# Patient Record
Sex: Male | Born: 1985 | Hispanic: No | Marital: Married | Smoking: Never smoker
Health system: Southern US, Community
[De-identification: ages and names within clinical notes are randomized; demographics above are authoritative.]

## PROBLEM LIST (undated history)

## (undated) HISTORY — PX: ABDOMINAL SURGERY: SHX537

---

## 2016-03-19 ENCOUNTER — Emergency Department (HOSPITAL_COMMUNITY): Payer: BLUE CROSS/BLUE SHIELD

## 2016-03-19 ENCOUNTER — Emergency Department (HOSPITAL_COMMUNITY)
Admission: EM | Admit: 2016-03-19 | Discharge: 2016-03-19 | Disposition: A | Discharge status: Home / Self Care | Payer: BLUE CROSS/BLUE SHIELD | Attending: Emergency Medicine | Admitting: Emergency Medicine

## 2016-03-19 ENCOUNTER — Encounter (HOSPITAL_COMMUNITY): Payer: Self-pay

## 2016-03-19 DIAGNOSIS — R05 Cough: Secondary | ICD-10-CM | POA: Diagnosis present

## 2016-03-19 DIAGNOSIS — J0100 Acute maxillary sinusitis, unspecified: Secondary | ICD-10-CM

## 2016-03-19 MED ORDER — DOXYCYCLINE HYCLATE 100 MG PO CAPS: 100.0000 mg | ORAL_CAPSULE | Freq: Two times a day (BID) | ORAL | 0 refills | Status: DC

## 2016-03-19 MED ORDER — FLUTICASONE PROPIONATE 50 MCG/ACT NA SUSP: 1.0000 | Freq: Every day | NASAL | 0 refills | Status: AC

## 2016-03-19 NOTE — ED Provider Notes (Signed)
[facility]-EMERGENCY DEPT Provider Note   CSN: [id number] Arrival date & time: 03/19/16  1532   By signing my name below, I, Martin Jackson, attest that this documentation has been prepared under the direction and in the presence of Audry Pili, PA-C. Electronically Signed: Freida Jackson, Scribe. 03/19/2016. 4:52 PM.   History   Chief Complaint Chief Complaint  Patient presents with  . Cough  . Otalgia    LEFT     The history is provided by the patient. No language interpreter was used.   HPI Comments:  Martin Jackson is a 31 y.o. male who presents to the Emergency Department complaining of dry cough x 2 days; worse today. He has associated sore throat, left ear pain, subjective fever, and rhinorrhea. He has taken OTC cold and sinus medicine and states he seemed to worsen afterward. He denies nausea, vomiting, and diarrhea. No other symptoms noted.    History reviewed. No pertinent past medical history.  There are no active problems to display for this patient.   History reviewed. No pertinent surgical history.   Home Medications    Prior to Admission medications   Not on File    Family History History reviewed. No pertinent family history.  Social History Social History  Substance Use Topics  . Smoking status: Never Smoker  . Smokeless tobacco: Never Used  . Alcohol use No     Allergies   Nyquil multi-symptom [pseudoeph-doxylamine-dm-apap]   Review of Systems Review of Systems  Constitutional: Positive for fever.  HENT: Positive for ear pain, rhinorrhea and sore throat.   Respiratory: Positive for cough.   Gastrointestinal: Negative for diarrhea, nausea and vomiting.     Physical Exam Updated Vital Signs BP 125/82 (BP Location: Left Arm)   Pulse 99   Temp 98.6 F (37 C) (Oral)   Resp 20   Ht 5\' 8"  (1.727 m)   Wt 154 lb 5.2 oz (70 kg)   SpO2 99%   BMI 23.46 kg/m   Physical Exam  Constitutional: He is oriented to person, place, and time. He appears  well-developed and well-nourished. No distress.  HENT:  Head: Normocephalic and atraumatic.  Right Ear: Tympanic membrane, external ear and ear canal normal.  Left Ear: Tympanic membrane, external ear and ear canal normal.  Nose: Nose normal.  Mouth/Throat: Uvula is midline, oropharynx is clear and moist and mucous membranes are normal. No trismus in the jaw. No oropharyngeal exudate, posterior oropharyngeal erythema or tonsillar abscesses. No tonsillar exudate.  Eyes: Conjunctivae and EOM are normal. Pupils are equal, round, and reactive to light.  Neck: Normal range of motion. Neck supple. No tracheal deviation present.  Cardiovascular: Normal rate, regular rhythm, S1 normal, S2 normal, normal heart sounds, intact distal pulses and normal pulses.   Pulmonary/Chest: Effort normal and breath sounds normal. No respiratory distress. He has no decreased breath sounds. He has no wheezes. He has no rhonchi. He has no rales.  Abdominal: Normal appearance and bowel sounds are normal. He exhibits no distension. There is no tenderness.  Musculoskeletal: Normal range of motion.  Neurological: He is alert and oriented to person, place, and time.  Skin: Skin is warm and dry.  Psychiatric: He has a normal mood and affect. His speech is normal and behavior is normal. Thought content normal.  Nursing note and vitals reviewed.  ED Treatments / Results  DIAGNOSTIC STUDIES:  Oxygen Saturation is 99% on RA, normal by my interpretation.    COORDINATION OF CARE:  4:52 PM Discussed  treatment plan with pt at bedside and pt agreed to plan.  Labs (all labs ordered are listed, but only abnormal results are displayed) Labs Reviewed - No data to display  EKG  EKG Interpretation None       Radiology No results found.  Procedures Procedures (including critical care time)  Medications Ordered in ED Medications - No data to display   Initial Impression / Assessment and Plan / ED Course  I have  reviewed the triage vital signs and the nursing notes.  Pertinent labs & imaging results that were available during my care of the patient were reviewed by me and considered in my medical decision making (see chart for details).  Final Clinical Impressions(s) / ED Diagnoses      {I have reviewed the relevant previous healthcare records.  {I obtained HPI from historian.   ED Course:  Assessment: Pt is a 31 y.o. male presents with URI symptoms x2 days. No fever. On exam, pt in NAD. VSS. Afebrile. Lungs CTA, Heart RRR. Abdomen nontender/soft. Pt CXR negative for acute infiltrate. Patients symptoms are consistent with URI, likely viral etiology. Discussed that antibiotics are not indicated for viral infections. Given Rx Doxy to take in 1 week with no improvement. Likely viral sinusitis causing pressure in left ear as exam unremarkable. Pt will be discharged with symptomatic treatment.  Verbalizes understanding and is agreeable with plan. Pt is hemodynamically stable & in NAD prior to dc.  Disposition/Plan:  DC Home Additional Verbal discharge instructions given and discussed with patient.  Pt Instructed to f/u with PCP in the next week for evaluation and treatment of symptoms. Return precautions given Pt acknowledges and agrees with plan  Supervising Physician Lyndal Pulleyaniel Knott, MD  Final diagnoses:  Acute non-recurrent maxillary sinusitis    New Prescriptions New Prescriptions   No medications on file    I personally performed the services described in this documentation, which was scribed in my presence. The recorded information has been reviewed and is accurate.    Audry Piliyler Julion Gatt, PA-C 03/19/16 1658    Lyndal Pulleyaniel Knott, MD 03/20/16 [phone]

## 2016-03-19 NOTE — Discharge Instructions (Signed)
Please read and follow all provided instructions.  Your diagnoses today include:  1. Acute non-recurrent maxillary sinusitis     Tests performed today include: Vital signs. See below for your results today.   Medications prescribed:  Take as prescribed   Home care instructions:  Follow any educational materials contained in this packet.  Follow-up instructions: Please follow-up with your primary care provider for further evaluation of symptoms and treatment   Return instructions:  Please return to the Emergency Department if you do not get better, if you get worse, or new symptoms OR  - Fever (temperature greater than 101.18F)  - Bleeding that does not stop with holding pressure to the area    -Severe pain (please note that you may be more sore the day after your accident)  - Chest Pain  - Difficulty breathing  - Severe nausea or vomiting  - Inability to tolerate food and liquids  - Passing out  - Skin becoming red around your wounds  - Change in mental status (confusion or lethargy)  - New numbness or weakness    Please return if you have any other emergent concerns.  Additional Information:  Your vital signs today were: BP 125/82 (BP Location: Left Arm)    Pulse 99    Temp 98.6 F (37 C) (Oral)    Resp 20    Ht 5\' 8"  (1.727 m)    Wt 70 kg    SpO2 99%    BMI 23.46 kg/m  If your blood pressure (BP) was elevated above 135/85 this visit, please have this repeated by your doctor within one month. ---------------

## 2016-03-19 NOTE — ED Triage Notes (Signed)
PT C/O COUGH, SORE THROAT, AND LEFT EAR ACHE X2 DAYS. DENIES FEVER.

## 2017-09-04 ENCOUNTER — Ambulatory Visit (HOSPITAL_COMMUNITY): Payer: BLUE CROSS/BLUE SHIELD | Admitting: Psychiatry

## 2018-05-24 ENCOUNTER — Encounter (HOSPITAL_COMMUNITY): Payer: Self-pay | Admitting: Emergency Medicine

## 2018-05-24 ENCOUNTER — Emergency Department (HOSPITAL_COMMUNITY)
Admission: EM | Admit: 2018-05-24 | Discharge: 2018-05-24 | Disposition: A | Discharge status: Home / Self Care | Payer: BLUE CROSS/BLUE SHIELD | Attending: Emergency Medicine | Admitting: Emergency Medicine

## 2018-05-24 ENCOUNTER — Other Ambulatory Visit: Payer: Self-pay

## 2018-05-24 DIAGNOSIS — B349 Viral infection, unspecified: Secondary | ICD-10-CM | POA: Diagnosis not present

## 2018-05-24 DIAGNOSIS — Z03818 Encounter for observation for suspected exposure to other biological agents ruled out: Secondary | ICD-10-CM | POA: Insufficient documentation

## 2018-05-24 DIAGNOSIS — J029 Acute pharyngitis, unspecified: Secondary | ICD-10-CM | POA: Diagnosis present

## 2018-05-24 LAB — GROUP A STREP BY PCR: Group A Strep by PCR: NOT DETECTED

## 2018-05-24 NOTE — ED Triage Notes (Signed)
Pt reports that he had sore throat for couple days and before that he had a cough. Pt scared and wants to make sure he doesn't have "the virus". Denies fevers or recent travel.

## 2018-05-24 NOTE — ED Provider Notes (Signed)
Martin Jackson Provider Note   CSN: [id number] Arrival date & time: 05/24/18  1804    History   Chief Complaint Chief Complaint  Patient presents with  . Sore Throat    HPI Martin Jackson is a 33 y.o. male who presents to the ED complaining of a gradual onset, constant, sore throat x 3-4 days. Pt also reports he had an episode of coughing spells 4 days ago where he coughed several times in a row; pt is concerned he could have covid. No known exposure to covid positive patients although pt works at [employer] and states people have been coming in and out without masks. Pt denies fever, chills, drooling, difficulty swallowing, ear pain, shortness of breath, or any other associated symptoms.       History reviewed. No pertinent past medical history.  There are no active problems to display for this patient.   History reviewed. No pertinent surgical history.      Home Medications    Prior to Admission medications   Medication Sig Start Date End Date Taking? Authorizing Provider  doxycycline (VIBRAMYCIN) 100 MG capsule Take 1 capsule (100 mg total) by mouth 2 (two) times daily. 03/23/16   Audry PiliMohr, Tyler, PA-C  fluticasone (FLONASE) 50 MCG/ACT nasal spray Place 1 spray into both nostrils daily. 03/19/16   Audry PiliMohr, Tyler, PA-C    Family History No family history on file.  Social History Social History   Tobacco Use  . Smoking status: Never Smoker  . Smokeless tobacco: Never Used  Substance Use Topics  . Alcohol use: No  . Drug use: No     Allergies   Nyquil multi-symptom [pseudoeph-doxylamine-dm-apap]   Review of Systems Review of Systems  Constitutional: Negative for chills and fever.  HENT: Positive for sore throat. Negative for congestion, ear pain, rhinorrhea, sinus pain, trouble swallowing and voice change.   Eyes: Negative for pain and redness.  Respiratory: Positive for cough (resolved). Negative for shortness of breath.    Cardiovascular: Negative for chest pain.  Gastrointestinal: Negative for abdominal pain, constipation, diarrhea, nausea and vomiting.     Physical Exam Updated Vital Signs BP 136/86 (BP Location: Right Arm)   Pulse 90   Temp 98.1 F (36.7 C) (Oral)   Resp 18   SpO2 100%   Physical Exam Vitals signs and nursing note reviewed.  Constitutional:      Appearance: He is not ill-appearing.  HENT:     Head: Normocephalic and atraumatic.     Right Ear: Tympanic membrane normal.     Left Ear: Tympanic membrane normal.     Nose: No congestion or rhinorrhea.     Mouth/Throat:     Mouth: Mucous membranes are moist.     Pharynx: Posterior oropharyngeal erythema present. No oropharyngeal exudate or uvula swelling.     Tonsils: No tonsillar exudate or tonsillar abscesses.  Eyes:     Conjunctiva/sclera: Conjunctivae normal.  Neck:     Musculoskeletal: Normal range of motion and neck supple.  Cardiovascular:     Rate and Rhythm: Normal rate and regular rhythm.     Heart sounds: Normal heart sounds. No murmur.  Pulmonary:     Effort: Pulmonary effort is normal.     Breath sounds: Normal breath sounds. No wheezing, rhonchi or rales.  Abdominal:     Palpations: Abdomen is soft.     Tenderness: There is no abdominal tenderness.  Lymphadenopathy:     Cervical: No cervical adenopathy.  Skin:  General: Skin is warm and dry.  Neurological:     Mental Status: He is alert.      ED Treatments / Results  Labs (all labs ordered are listed, but only abnormal results are displayed) Labs Reviewed  GROUP A STREP BY PCR  NOVEL CORONAVIRUS, NAA (HOSPITAL ORDER, SEND-OUT TO REF LAB)    EKG None  Radiology No results found.  Procedures Procedures (including critical care time)  Medications Ordered in ED Medications - No data to display   Initial Impression / Assessment and Plan / ED Course  I have reviewed the triage vital signs and the nursing notes.  Pertinent labs & imaging  results that were available during my care of the patient were reviewed by me and considered in my medical decision making (see chart for details).    Pt is a 32 year old pt who presents with sore throat x 3-4 days. 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 Pt had a coughing fit 4 days ago but has not coughed since. He is afebrile in the ED. Satting 100% on RA. No increased work of breathing or accessory muscle usage. Had a long discussion with patient regarding the fact that we are reserving tests for admitted patients; pt seems to not understand this fact very well. Will test for strep given sore throat at this point and may consider sending out send out test for covid given patients increased anxiety.   Strep test negative. Patient still very anxious about what is causing his symptoms; suspect viral pharyngitis at this time. When told he likely has a virus causing his sore throat he appears very concerned I am talking about coronavirus. Will send [facility] test at this time to reassure patient. Advised to stay home until he receives results. Pt is in agreement with plan at this time.  Martin Jackson was evaluated in Emergency Department on 05/24/2018 for the symptoms described in the history of present illness. He was evaluated in the context of the global COVID-19 pandemic, which necessitated consideration that the patient might be at risk for infection with the SARS-CoV-2 virus that causes COVID-19. Institutional protocols and algorithms that pertain to the evaluation of patients at risk for COVID-19 are in a state of rapid change based on information released by regulatory bodies including the CDC and federal and state organizations. These policies and algorithms were followed during the patient's care in the ED.        Final Clinical Impressions(s) / ED Diagnoses   Final diagnoses:  Viral pharyngitis  Viral illness    ED  Discharge Orders    None       Tanda Rockers, Cordelia Poche 05/24/18 2126    Gerhard Munch, MD 05/24/18 2317

## 2018-05-24 NOTE — Discharge Instructions (Addendum)
You were seen in the ED today for a sore throat; your strep test was negative. We have sent out a coronavirus test; it can take up to 5 days to get back. Please stay home until you get a call about the results. Please follow up with your PCP; if you do not have one you can follow up with [facility] for your primary care needs.

## 2018-05-26 LAB — NOVEL CORONAVIRUS, NAA (HOSP ORDER, SEND-OUT TO REF LAB; TAT 18-24 HRS): SARS-CoV-2, NAA: NOT DETECTED

## 2018-09-19 ENCOUNTER — Other Ambulatory Visit: Payer: Self-pay

## 2018-09-19 DIAGNOSIS — Z20822 Contact with and (suspected) exposure to covid-19: Secondary | ICD-10-CM

## 2018-09-21 LAB — NOVEL CORONAVIRUS, NAA: SARS-CoV-2, NAA: NOT DETECTED

## 2018-12-26 ENCOUNTER — Emergency Department (HOSPITAL_COMMUNITY)
Admission: EM | Admit: 2018-12-26 | Discharge: 2018-12-27 | Disposition: A | Discharge status: Home / Self Care | Payer: BLUE CROSS/BLUE SHIELD | Attending: Emergency Medicine | Admitting: Emergency Medicine

## 2018-12-26 ENCOUNTER — Emergency Department (HOSPITAL_COMMUNITY): Payer: BLUE CROSS/BLUE SHIELD

## 2018-12-26 ENCOUNTER — Encounter (HOSPITAL_COMMUNITY): Payer: Self-pay | Admitting: Emergency Medicine

## 2018-12-26 ENCOUNTER — Other Ambulatory Visit: Payer: Self-pay

## 2018-12-26 DIAGNOSIS — R079 Chest pain, unspecified: Secondary | ICD-10-CM | POA: Diagnosis present

## 2018-12-26 DIAGNOSIS — R0789 Other chest pain: Secondary | ICD-10-CM | POA: Insufficient documentation

## 2018-12-26 LAB — CBC
HCT: 46.3 % (ref 39.0–52.0)
Hemoglobin: 15.3 g/dL (ref 13.0–17.0)
MCH: 30.9 pg (ref 26.0–34.0)
MCHC: 33 g/dL (ref 30.0–36.0)
MCV: 93.5 fL (ref 80.0–100.0)
Platelets: 221 10*3/uL (ref 150–400)
RBC: 4.95 MIL/uL (ref 4.22–5.81)
RDW: 12 % (ref 11.5–15.5)
WBC: 7.5 10*3/uL (ref 4.0–10.5)
nRBC: 0 % (ref 0.0–0.2)

## 2018-12-26 MED ORDER — SODIUM CHLORIDE 0.9% FLUSH: 3.0000 mL | Freq: Once | INTRAVENOUS | Status: DC

## 2018-12-26 NOTE — ED Triage Notes (Signed)
Patient complaining of left chest pain that has been going on for three weeks. Patient states it feels like it is going to bust out.

## 2018-12-27 ENCOUNTER — Other Ambulatory Visit: Payer: Self-pay

## 2018-12-27 LAB — BASIC METABOLIC PANEL
Anion gap: 6 (ref 5–15)
BUN: 21 mg/dL — ABNORMAL HIGH (ref 6–20)
CO2: 30 mmol/L (ref 22–32)
Calcium: 9.5 mg/dL (ref 8.9–10.3)
Chloride: 105 mmol/L (ref 98–111)
Creatinine, Ser: 0.79 mg/dL (ref 0.61–1.24)
GFR calc Af Amer: >60 mL/min (ref >60)
GFR calc non Af Amer: >60 mL/min (ref >60)
Glucose, Bld: 92 mg/dL (ref 70–99)
Potassium: 4.4 mmol/L (ref 3.5–5.1)
Sodium: 141 mmol/L (ref 135–145)

## 2018-12-27 LAB — TROPONIN I (HIGH SENSITIVITY)
Troponin I (High Sensitivity): 3 ng/L (ref <18)
Troponin I (High Sensitivity): <2 ng/L (ref <18)

## 2018-12-27 NOTE — ED Notes (Signed)
Repeat EKG done, per [place], [place]

## 2018-12-27 NOTE — ED Notes (Signed)
PT DISCHARGED. INSTRUCTIONS GIVEN. AAOX4. PT IN NO APPARENT DISTRESS OR PAIN. THE OPPORTUNITY TO ASK QUESTIONS WAS PROVIDED. 

## 2018-12-27 NOTE — ED Provider Notes (Signed)
[facility]-EMERGENCY DEPT Provider Note   CSN: [id number] Arrival date & time: 12/26/18  2210     History Chief Complaint  Patient presents with  . Chest Pain    Martin Jackson is a 33 y.o. male with no significant past medical history presents today for evaluation of left-sided chest pain.  He reports that it has been going on intermittently over the past few months however has gotten more frequent over the past week.  He states that he notes his chest pain gets worse when he argues with his wife or has anything stressful happen.  He states that the chest pain lasts for 1 to 3 hours before it goes away.  He reports he is under significant stress.  He denies any fevers.  No shortness of breath.  No nausea, vomiting, or diarrhea.  He denies any hemoptysis.  No history of VTE.  He denies any family history of MI before the age of 4.  No personal cardiac history.  He denies any recent trauma.  His pain does not radiate or move.  No specific alleviating factors noted, rather "it just goes away."    HPI     History reviewed. No pertinent past medical history.  There are no problems to display for this patient.   History reviewed. No pertinent surgical history.     History reviewed. No pertinent family history.  Social History   Tobacco Use  . Smoking status: Never Smoker  . Smokeless tobacco: Never Used  Substance Use Topics  . Alcohol use: No  . Drug use: No    Home Medications Prior to Admission medications   Medication Sig Start Date End Date Taking? Authorizing Provider  doxycycline (VIBRAMYCIN) 100 MG capsule Take 1 capsule (100 mg total) by mouth 2 (two) times daily. 03/23/16   Audry Pili, PA-C  fluticasone (FLONASE) 50 MCG/ACT nasal spray Place 1 spray into both nostrils daily. 03/19/16   Audry Pili, PA-C    Allergies    Nyquil multi-symptom [pseudoeph-doxylamine-dm-apap]  Review of Systems   Review of Systems  Constitutional: Negative  for chills and fever.  HENT: Negative for congestion.   Eyes: Negative for visual disturbance.  Respiratory: Negative for choking and shortness of breath.   Cardiovascular: Positive for chest pain. Negative for palpitations and leg swelling.  Gastrointestinal: Negative for abdominal pain, diarrhea, nausea and vomiting.  Genitourinary: Negative for dysuria and urgency.  Musculoskeletal: Negative for back pain and neck pain.  Skin: Negative for color change, rash and wound.  Neurological: Negative for light-headedness and headaches.  Psychiatric/Behavioral: The patient is nervous/anxious.   All other systems reviewed and are negative.   Physical Exam Updated Vital Signs BP 110/82 (BP Location: Left Arm)   Pulse 63   Temp 98 F (36.7 C) (Oral)   Resp 17   Ht 5\' 8"  (1.727 m)   Wt 81.6 kg   SpO2 99%   BMI 27.37 kg/m   Physical Exam Vitals and nursing note reviewed.  Constitutional:      Appearance: He is well-developed.  HENT:     Head: Normocephalic and atraumatic.  Eyes:     Conjunctiva/sclera: Conjunctivae normal.  Cardiovascular:     Rate and Rhythm: Normal rate and regular rhythm.     Pulses:          Radial pulses are 2+ on the right side and 2+ on the left side.       Dorsalis pedis pulses are 2+ on the right  side and 2+ on the left side.       Posterior tibial pulses are 2+ on the right side and 2+ on the left side.     Heart sounds: Normal heart sounds. No murmur.  Pulmonary:     Effort: Pulmonary effort is normal. No respiratory distress.     Breath sounds: Normal breath sounds. No decreased breath sounds or wheezing.  Chest:     Chest wall: Tenderness (Mild) present. No deformity.  Abdominal:     Palpations: Abdomen is soft.     Tenderness: There is no abdominal tenderness.  Musculoskeletal:     Cervical back: Normal range of motion and neck supple.     Right lower leg: No tenderness. No edema.     Left lower leg: No tenderness. No edema.  Skin:     General: Skin is warm and dry.  Neurological:     General: No focal deficit present.     Mental Status: He is alert.     Cranial Nerves: No cranial nerve deficit.  Psychiatric:        Mood and Affect: Mood is anxious.        Behavior: Behavior normal.     ED Results / Procedures / Treatments   Labs (all labs ordered are listed, but only abnormal results are displayed) Labs Reviewed  BASIC METABOLIC PANEL - Abnormal; Notable for the following components:      Result Value   BUN 21 (*)    All other components within normal limits  CBC  TROPONIN I (HIGH SENSITIVITY)  TROPONIN I (HIGH SENSITIVITY)    EKG EKG Interpretation  Date/Time:  Saturday December 27 2018 02:11:05 EST Ventricular Rate:  61 PR Interval:    QRS Duration: 109 QT Interval:  432 QTC Calculation: 436 R Axis:   32 Text Interpretation: Sinus rhythm Confirmed by Randal Buba, April ([id number]) on 12/27/2018 2:28:48 AM   Radiology DG Chest 2 View  Result Date: 12/26/2018 CLINICAL DATA:  Chest pain EXAM: CHEST - 2 VIEW COMPARISON:  March 19, 2016 FINDINGS: The heart size and mediastinal contours are within normal limits. Both lungs are clear. The visualized skeletal structures are unremarkable. IMPRESSION: No active cardiopulmonary disease. Electronically Signed   By: Constance Holster M.D.   On: 12/26/2018 23:05    Procedures Procedures (including critical care time)  Medications Ordered in ED Medications - No data to display  ED Course  I have reviewed the triage vital signs and the nursing notes.  Pertinent labs & imaging results that were available during my care of the patient were reviewed by me and considered in my medical decision making (see chart for details).    MDM Rules/Calculators/A&P                     Patient presents today for evaluation of chest pain worsening over the past 3 weeks.  His chest pain is intermittent and appears to be triggered by stressful events such as arguing with his  wife.  He denies any shortness of breath.  He is PERC negative.  EKG without evidence of ischemia, troponin x2 not elevated do not suspect ACS.  Chest x-ray obtained without evidence of pneumothorax, consolidation, or other abnormalities, do not suspect pneumonia.  He denies any history of asthma or wheezing, no fevers.  Labs are obtained and reviewed without significant hematologic or electrolyte derangements.  I suspect that his cp is related to msk pain with a significant component of anxiety.  Return precautions were discussed with patient who states their understanding.  At the time of discharge patient denied any unaddressed complaints or concerns.  Patient is agreeable for discharge home.   Final Clinical Impression(s) / ED Diagnoses Final diagnoses:  Atypical chest pain    Rx / DC Orders ED Discharge Orders    None       Norman ClayHammond, Hortencia Martire W, PA-C 12/27/18 [id number]    Palumbo, April, MD 12/27/18 [phone]

## 2019-05-06 ENCOUNTER — Other Ambulatory Visit: Payer: Self-pay

## 2019-05-06 ENCOUNTER — Emergency Department (HOSPITAL_COMMUNITY)
Admission: EM | Admit: 2019-05-06 | Discharge: 2019-05-07 | Disposition: A | Discharge status: Other Acute Inpatient Hospital | Payer: BLUE CROSS/BLUE SHIELD | Attending: Emergency Medicine | Admitting: Emergency Medicine

## 2019-05-06 DIAGNOSIS — Y999 Unspecified external cause status: Secondary | ICD-10-CM | POA: Insufficient documentation

## 2019-05-06 DIAGNOSIS — Y9389 Activity, other specified: Secondary | ICD-10-CM | POA: Insufficient documentation

## 2019-05-06 DIAGNOSIS — Y929 Unspecified place or not applicable: Secondary | ICD-10-CM | POA: Insufficient documentation

## 2019-05-06 DIAGNOSIS — X780XXA Intentional self-harm by sharp glass, initial encounter: Secondary | ICD-10-CM | POA: Insufficient documentation

## 2019-05-06 DIAGNOSIS — S61412A Laceration without foreign body of left hand, initial encounter: Secondary | ICD-10-CM

## 2019-05-06 DIAGNOSIS — Z20822 Contact with and (suspected) exposure to covid-19: Secondary | ICD-10-CM | POA: Insufficient documentation

## 2019-05-06 DIAGNOSIS — F322 Major depressive disorder, single episode, severe without psychotic features: Secondary | ICD-10-CM | POA: Insufficient documentation

## 2019-05-06 DIAGNOSIS — R45851 Suicidal ideations: Secondary | ICD-10-CM | POA: Insufficient documentation

## 2019-05-06 LAB — RAPID URINE DRUG SCREEN, HOSP PERFORMED
Amphetamines: NOT DETECTED
Barbiturates: NOT DETECTED
Benzodiazepines: NOT DETECTED
Cocaine: NOT DETECTED
Opiates: NOT DETECTED
Tetrahydrocannabinol: NOT DETECTED

## 2019-05-06 LAB — CBC WITH DIFFERENTIAL/PLATELET
Abs Immature Granulocytes: 0.01 10*3/uL (ref 0.00–0.07)
Basophils Absolute: 0 10*3/uL (ref 0.0–0.1)
Basophils Relative: 0 %
Eosinophils Absolute: 0 10*3/uL (ref 0.0–0.5)
Eosinophils Relative: 0 %
HCT: 46.5 % (ref 39.0–52.0)
Hemoglobin: 15.6 g/dL (ref 13.0–17.0)
Immature Granulocytes: 0 %
Lymphocytes Relative: 16 %
Lymphs Abs: 1.2 10*3/uL (ref 0.7–4.0)
MCH: 30.6 pg (ref 26.0–34.0)
MCHC: 33.5 g/dL (ref 30.0–36.0)
MCV: 91.2 fL (ref 80.0–100.0)
Monocytes Absolute: 0.6 10*3/uL (ref 0.1–1.0)
Monocytes Relative: 7 %
Neutro Abs: 5.8 10*3/uL (ref 1.7–7.7)
Neutrophils Relative %: 77 %
Platelets: 141 10*3/uL — ABNORMAL LOW (ref 150–400)
RBC: 5.1 MIL/uL (ref 4.22–5.81)
RDW: 11.6 % (ref 11.5–15.5)
WBC: 7.6 10*3/uL (ref 4.0–10.5)
nRBC: 0 % (ref 0.0–0.2)

## 2019-05-06 LAB — URINALYSIS, ROUTINE W REFLEX MICROSCOPIC
Bilirubin Urine: NEGATIVE
Glucose, UA: NEGATIVE mg/dL
Ketones, ur: 80 mg/dL — AB
Leukocytes,Ua: NEGATIVE
Nitrite: NEGATIVE
Protein, ur: 30 mg/dL — AB
Specific Gravity, Urine: 1.031 — ABNORMAL HIGH (ref 1.005–1.030)
pH: 5 (ref 5.0–8.0)

## 2019-05-06 LAB — ETHANOL: Alcohol, Ethyl (B): <10 mg/dL (ref <10)

## 2019-05-06 LAB — BASIC METABOLIC PANEL
Anion gap: 10 (ref 5–15)
BUN: 22 mg/dL — ABNORMAL HIGH (ref 6–20)
CO2: 25 mmol/L (ref 22–32)
Calcium: 9.4 mg/dL (ref 8.9–10.3)
Chloride: 102 mmol/L (ref 98–111)
Creatinine, Ser: 0.81 mg/dL (ref 0.61–1.24)
GFR calc Af Amer: >60 mL/min (ref >60)
GFR calc non Af Amer: >60 mL/min (ref >60)
Glucose, Bld: 89 mg/dL (ref 70–99)
Potassium: 3.5 mmol/L (ref 3.5–5.1)
Sodium: 137 mmol/L (ref 135–145)

## 2019-05-06 MED ORDER — LIDOCAINE HCL (PF) 1 % IJ SOLN: 5.0000 mL | Freq: Once | INTRAMUSCULAR | Status: DC

## 2019-05-06 MED ORDER — LIDOCAINE HCL 1 % IJ SOLN: 5.0000 mL | Freq: Once | INTRAMUSCULAR | Status: DC

## 2019-05-06 MED ORDER — LIDOCAINE HCL 1 % IJ SOLN
INTRAMUSCULAR | Status: AC
  Filled 2019-05-06: qty 20

## 2019-05-06 NOTE — ED Provider Notes (Signed)
[facility]-EMERGENCY DEPT Provider Note   CSN: [id number] Arrival date & time: 05/06/19  1804     History Chief Complaint  Patient presents with  . Suicide Attempt    Martin Jackson is a 34 y.o. male.  Patient is a 34 year old male with no past medical or psychiatric history.  He presents today for evaluation of a self-inflicted laceration to the left hand.  He reports feelings of depression and suicidal ideation recently and did this in attempt to harm, but not kill himself.  His ex-wife called 911 and he was brought here.  He denies drug or alcohol use.  No other complaints.  The history is provided by the patient.       No past medical history on file.  There are no problems to display for this patient.   No past surgical history on file.     No family history on file.  Social History   Tobacco Use  . Smoking status: Never Smoker  . Smokeless tobacco: Never Used  Substance Use Topics  . Alcohol use: No  . Drug use: No    Home Medications Prior to Admission medications   Medication Sig Start Date End Date Taking? Authorizing Provider  doxycycline (VIBRAMYCIN) 100 MG capsule Take 1 capsule (100 mg total) by mouth 2 (two) times daily. 03/23/16   Audry Pili, PA-C  fluticasone (FLONASE) 50 MCG/ACT nasal spray Place 1 spray into both nostrils daily. 03/19/16   Audry Pili, PA-C    Allergies    Nyquil multi-symptom [pseudoeph-doxylamine-dm-apap]  Review of Systems   Review of Systems  All other systems reviewed and are negative.   Physical Exam Updated Vital Signs BP (!) 118/96 (BP Location: Left Arm)   Pulse 84   Temp (!) 97.4 F (36.3 C) (Oral)   Resp 16   Ht 5\' 8"  (1.727 m)   Wt 75 kg   SpO2 98%   BMI 25.14 kg/m   Physical Exam Vitals and nursing note reviewed.  Constitutional:      General: He is not in acute distress.    Appearance: He is well-developed. He is not diaphoretic.  HENT:     Head: Normocephalic and  atraumatic.  Cardiovascular:     Rate and Rhythm: Normal rate and regular rhythm.     Heart sounds: No murmur. No friction rub.  Pulmonary:     Effort: Pulmonary effort is normal. No respiratory distress.     Breath sounds: Normal breath sounds. No wheezing or rales.  Abdominal:     General: Bowel sounds are normal. There is no distension.     Palpations: Abdomen is soft.     Tenderness: There is no abdominal tenderness.  Musculoskeletal:        General: Normal range of motion.     Cervical back: Normal range of motion and neck supple.     Comments: There is a 1.5 cm laceration to the lateral aspect of the left hand over the fifth metacarpal.  He has full range of motion of the fifth digit with no evidence for tendon laceration.  Capillary refill is brisk and sensation is intact throughout the entire fifth finger.  Skin:    General: Skin is warm and dry.  Neurological:     Mental Status: He is alert and oriented to person, place, and time.     Coordination: Coordination normal.     ED Results / Procedures / Treatments   Labs (all labs ordered  are listed, but only abnormal results are displayed) Labs Reviewed  BASIC METABOLIC PANEL  CBC WITH DIFFERENTIAL/PLATELET  ETHANOL  URINALYSIS, ROUTINE W REFLEX MICROSCOPIC  RAPID URINE DRUG SCREEN, HOSP PERFORMED    EKG None  Radiology No results found.  Procedures Procedures (including critical care time)  Medications Ordered in ED Medications  lidocaine (PF) (XYLOCAINE) 1 % injection 5 mL (has no administration in time range)    ED Course  I have reviewed the triage vital signs and the nursing notes.  Pertinent labs & imaging results that were available during my care of the patient were reviewed by me and considered in my medical decision making (see chart for details).    MDM Rules/Calculators/A&P  Patient presenting here with complaints of a self-inflicted wound to his left hand he states that he made attempting to  harm himself.  He reports a history of depression worsening over the past year since the divorce from his wife.  Patient has been evaluated by TTS and felt to meet inpatient criteria.  Patient is voluntary and agrees to this treatment.  The laceration was repaired as below.  LACERATION REPAIR Performed by: Veryl Speak Authorized by: Veryl Speak Consent: Verbal consent obtained. Risks and benefits: risks, benefits and alternatives were discussed Consent given by: patient Patient identity confirmed: provided demographic data Prepped and Draped in normal sterile fashion Wound explored  Laceration Location: left hand  Laceration Length: 2.5cm  No Foreign Bodies seen or palpated  Anesthesia: local infiltration  Local anesthetic: lidocaine 1% without epinephrine  Anesthetic total: 3 ml  Irrigation method: syringe Amount of cleaning: standard  Skin closure: 4-0 prolene  Number of sutures: 3  Technique: simple interrupted  Patient tolerance: Patient tolerated the procedure well with no immediate complications.   Final Clinical Impression(s) / ED Diagnoses Final diagnoses:  None    Rx / DC Orders ED Discharge Orders    None       Veryl Speak, MD 05/06/19 2337

## 2019-05-06 NOTE — Progress Notes (Signed)
Patient meets inpatient criteria per Renaye Rakers, NP. Patient has been referred to the following facilities for review:   [facility]  [facility]  [facility]  [facility]  [facility]  [facility]  [facility]  TTS will continue to follow and assist with securing bed placement.   Drucilla Schmidt, MSW, LCSW-A Clinical Disposition Social Worker [facility]/TTS [phone]

## 2019-05-06 NOTE — Progress Notes (Signed)
05/06/2019  1840  Labs drawn.

## 2019-05-06 NOTE — Progress Notes (Signed)
Received Martin Jackson at the change of shift awake in his room with the sitter at the bedside. He made several phone calls annd talked with TTS. His wife called after 12 mn to talk with him and was informed he is sleeping. The EKG and Covid test was done at 053500 hrs.

## 2019-05-06 NOTE — ED Triage Notes (Signed)
05/06/2019  1812  Patient states he cut himself with glass in the hand as a SI.

## 2019-05-06 NOTE — BH Assessment (Signed)
Tele Assessment Note   Patient Name: Martin Jackson MRN: [id number] Referring Physician: Chriss Driver Location of Patient: [facility] Location of Provider: Behavioral Health TTS Department  Martin Jackson is an 34 y.o. male. Pt [facility] to [facility] accompanied by EMS for suicidal attempt of cutting his hand. Pt states that he was arguing with his wife on the phone and became upset and took a glass cup and cut himself with it. Pt denies current SI, HI, AVH but states he was Si earlier when this occurred and states that, " I wish I wasn't alive". Pt has no prior psychr inpatient treatment history and has never had a provider or took any psychiatric medications. Pt denies any drugs or alcohol past or present. Pt states he has engaged in self injurious behaviors of banging his head against a wall a few times and recently did a few days ago. Pt denies any family history of SI, drug abuse, mental health or any other forms of abuse. Pt states he sleeps about 5 hours a night and has a good appetite but currently fasting right now. Pt endorses symptoms of depression such as : Tearfulness, worthlessness, isolation, anxiety and hopelessness. Pt states he has been depressed for a few months and found out his wife has been cheating on him since last year and they are currently experiencing other marital issues. Pt states he is currently full time student at [employer]  And working as well but stressed about finances, failing classes and not being able to see his son due to wife moving out of [place] last year. Pt states he is wanting to receive services for depression and willing to take medications as well. Pt gave TTS permission to contact his wife.   Collateral: TTS called pt wife Minda Meo at [phone] for additional information. Pts wife states that pt and her were arguing over phone and he took a champagne flute and smashed it over his head as well as cut self with glass on hand. She states that pt does  not have prior psych history but states he has attempted self injurious behavior 3 years ago by stabbing himself. She states that she wants pt to seek help for his depression.  Diagnosis: Major depressive disorder, Single episode, Severe  Past Medical History: No past medical history on file.  No past surgical history on file.  Family History: No family history on file.  Social History:  reports that he has never smoked. He has never used smokeless tobacco. He reports that he does not drink alcohol or use drugs.  Additional Social History:  Alcohol / Drug Use Pain Medications: see MAR Prescriptions: see MAR Over the Counter: see MAR  CIWA: CIWA-Ar BP: (!) 118/96 Pulse Rate: 84 COWS:    Allergies:  Allergies  Allergen Reactions  . Nyquil Multi-Symptom [Pseudoeph-Doxylamine-Dm-Apap] Hives    Home Medications: (Not in a hospital admission)   OB/GYN Status:  No LMP for male patient.  General Assessment Data Location of Assessment: [facility] TTS Assessment: In system Is this a Tele or Face-to-Face Assessment?: Tele Assessment Is this an Initial Assessment or a Re-assessment for this encounter?: Initial Assessment Patient Accompanied by:: N/A Language Other than English: No Admission Status: Voluntary     Crisis Care Plan Legal Guardian: Other:(self) Name of Psychiatrist: none Name of Therapist: none  Education Status Is patient currently in school?: No Is the patient employed, unemployed or receiving disability?: Employed  Risk to self with the past 6 months Suicidal Ideation: Yes-Currently  Present  Risk to Others within the past 6 months Homicidal Ideation: No Does patient have any lifetime risk of violence toward others beyond the six months prior to admission? : No Thoughts of Harm to Others: No Current Homicidal Intent: No Current Homicidal Plan: No Access to Homicidal Means: No Identified Victim: NA Violent Behavior Description: NA Criminal Charges  Pending?: No Does patient have a court date: No Is patient on probation?: No  Psychosis Hallucinations: None noted Delusions: None noted     Cognitive Functioning Concentration: Normal Memory: Recent Intact Is patient IDD: No  ADLScreening [facility] Assessment Services) Patient's cognitive ability adequate to safely complete daily activities?: Yes Patient able to express need for assistance with ADLs?: Yes Independently performs ADLs?: Yes (appropriate for developmental age)  Prior Inpatient Therapy Prior Inpatient Therapy: No  Prior Outpatient Therapy Prior Outpatient Therapy: No Does patient have an ACCT team?: No Does patient have Intensive In-House Services?  : No Does patient have [facility] services? : No Does patient have P4CC services?: No  ADL Screening (condition at time of admission) Patient's cognitive ability adequate to safely complete daily activities?: Yes Patient able to express need for assistance with ADLs?: Yes Independently performs ADLs?: Yes (appropriate for developmental age)             Regulatory affairs officer (For Healthcare) Does Patient Have a Medical Advance Directive?: No Would patient like information on creating a medical advance directive?: No - Guardian declined Nutrition Screen- [facility] Patient's home diet: Regular        Disposition: Adaku, Anike, FNP recommends pt for inpatient treatment. Per [facility] at capacity, TTS to seek placement for pt.    This service was provided via telemedicine using a 2-way, interactive audio and video technology.  Names of all persons participating in this telemedicine service and their role in this encounter. Name: Tayjon Halladay Role: Patient  Name: Antony Contras Role: TTS  Name:  Role:   Name:  Role:     Donato Heinz 05/06/2019 8:03 PM

## 2019-05-07 LAB — RESPIRATORY PANEL BY RT PCR (FLU A&B, COVID)
Influenza A by PCR: NEGATIVE
Influenza B by PCR: NEGATIVE
SARS Coronavirus 2 by RT PCR: NEGATIVE

## 2019-05-07 NOTE — BH Assessment (Signed)
Accepted to [facility] after the hospital receives a copy of pt's COVID test and a copy of his EKG. This information can be faxed to [phone]. Can arrive anytime after 0700. This information was provided to Joanne at 0347.  Room: [facility]  Accepting: Dr. Nathaniel Brooks  Attending: Dr. Stephanie Hill  Call to Report: [phone]   Address:  [facility]  [address]  [place], [place] [address]  [phone] 

## 2019-05-07 NOTE — ED Notes (Signed)
Covid results and EKG faxed to [facility].

## 2019-05-07 NOTE — BH Specialist Note (Addendum)
Patient re-assessed by Lovell Sheehan, NP.   Per Ophelia Shoulder, NP, patient's disposition is pending collateral information from his spouse. Also, to discuss a safety plan.   Clinician contacted patient's spouse-Mr. Hines at [phone]. She did not answer. Left a HIPPA compliant voicemail.

## 2019-05-07 NOTE — BH Assessment (Signed)
Collateral information obtained from patient's spouse/Ms. Martin Jackson [phone]. Per Ms. Martin Jackson, [name] has experienced a lot of stress over the past several months. States, "He has bottled things up for so long that he doesn't know how to cope". States that patient needs help in finding better ways to cope and deal with stress. She feels that their marriage issues is the trigger. She explains that they have a lot of issues but did not go into detail.  She doesn't feel that patient would harm himself or others. However, has noticed that he is increasingly angry angry evidenced by punching a hole in the wall and yelling. States that their 26 yr old son has witnessed this and it's not healthy.   She is aware that patient has concerns about an apartment lease that needs to be signed. Patient is unable to take care of this task because he is currently in the hospital. His spouse agrees to handle their housing/apartment/leasing affairs as patient is not able to do so at this time.  Spouse, patient, and 56 yr old son will all live together once patient is discharged from the hospital.   Patient to be transferred and admitted to [facility] in [place], [place].

## 2019-05-07 NOTE — ED Notes (Signed)
Accepted to [facility] after the hospital receives a copy of pt's COVID test and a copy of his EKG. This information can be faxed to [phone]. Can arrive anytime after 0700. This information was provided to [place] at [phone].  Room: [facility] Adult  Accepting: Dr. Sula Rumple  Attending: Dr. Haze Rushing  Call to Report: [phone]   Address:  [facility]  [address]  [place], [place] [address]  [phone]

## 2019-05-08 NOTE — Progress Notes (Signed)
Supervisor spoke with [facility] Director Wandra Mannan, plan to call police in [place] after verifying pt did not show up at the [facility].  Supervisor spoke with disposition CSW Wells Guiles who did confirm that pt is not at [facility].  See Maralyn Sago note regarding contacting [employer].  Supervisor spoke with Joselyn Glassman in Risk Management and informed her of the situation.  Misty Stanley will speak to Telford Nab and Misty Stanley also confirmed that safety zone will be completed.  Supervisor attended brief webex call with Wandra Mannan, Noreene Filbert, transportation director, and [facility] director Ethlyn Daniels where it was agreed that further conversation needs to be had about voluntary patients transported to inpatient psychiatric facilities.   Garner Nash, MSW, LCSW Advanced Care Supervisor 05/08/2019 10:07 AM

## 2019-05-08 NOTE — Progress Notes (Addendum)
CSW contacted the [employer] [phone]) to notify them that pt was apparently not dropped off at the correct hospital on 05/08/19. They were already aware, as [facility] had contacted them yesterday afternoon. An officer will call CSW back to put in a missing person report. They did state that pt had told staff at the community [facility] center, where he was dropped off, that pt had said he was going to get a rental car from [employer] and drive back to [place]. Because he was voluntary, and not under IVC, the police have not looked for him yet.   Wells Guiles, LCSW, LCAS Disposition CSW [facility]/TTS [phone]   UPDATE: Per [place] [employer], the last person who physically saw pt would need to place a missing person report.

## 2019-05-08 NOTE — Progress Notes (Signed)
CSW received phone call from pt, who stated that he was "doing well" and has an appointment with a psychiatrist next week. He reports that he is back in [place], [place] and is safe. He denies SI, HI, or A/V hallucinations.   Per Dr. Lucianne Muss, no additional follow up is necessary.   Wells Guiles, LCSW, LCAS Disposition CSW [facility]/TTS [phone]

## 2019-05-20 ENCOUNTER — Other Ambulatory Visit: Payer: Self-pay

## 2019-05-20 ENCOUNTER — Encounter (HOSPITAL_COMMUNITY): Payer: Self-pay

## 2019-05-20 ENCOUNTER — Emergency Department (HOSPITAL_COMMUNITY)
Admission: EM | Admit: 2019-05-20 | Discharge: 2019-05-20 | Disposition: A | Discharge status: Home / Self Care | Payer: Medicaid Other | Attending: Emergency Medicine | Admitting: Emergency Medicine

## 2019-05-20 DIAGNOSIS — S61412D Laceration without foreign body of left hand, subsequent encounter: Secondary | ICD-10-CM | POA: Diagnosis not present

## 2019-05-20 DIAGNOSIS — X58XXXD Exposure to other specified factors, subsequent encounter: Secondary | ICD-10-CM | POA: Diagnosis not present

## 2019-05-20 DIAGNOSIS — Z4802 Encounter for removal of sutures: Secondary | ICD-10-CM

## 2019-05-20 NOTE — ED Triage Notes (Signed)
Patient requesting suture removal of the left hand and states it has been 14 days.

## 2019-05-20 NOTE — Discharge Instructions (Signed)
Please follow up with your primary care provider within 5-7 days for re-evaluation of your symptoms. If you do not have a primary care provider, information for a healthcare clinic has been provided for you to make arrangements for follow up care. Please return to the emergency department for any new or worsening symptoms. ° °

## 2019-05-20 NOTE — ED Provider Notes (Signed)
[facility]-EMERG[facility]PT Provider Note   CSN: [id number] Arrival date & time: 05/20/19  1426     History Chief Complaint  Patient presents with  . Suture / Staple Removal    Martin Jackson is a 34 y.o. male.  HPI   33 year old male presenting for evaluation for suture removal.  Had sutures placed on 4/28.  States the wound is well-healing.  Denies any swelling or drainage of pus.  He has minimal pain to the area.  History reviewed. No pertinent past medical history.  There are no problems to display for this patient.   Past Surgical History:  Procedure Laterality Date  . ABDOMINAL SURGERY         Family History  Problem Relation Age of Onset  . Healthy Mother   . Healthy Father     Social History   Tobacco Use  . Smoking status: Never Smoker  . Smokeless tobacco: Never Used  Substance Use Topics  . Alcohol use: No  . Drug use: No    Home Medications Prior to Admission medications   Medication Sig Start Date End Date Taking? Authorizing Provider  doxycycline (VIBRAMYCIN) 100 MG capsule Take 1 capsule (100 mg total) by mouth 2 (two) times daily. Patient not taking: Reported on 05/06/2019 03/23/16   Audry Pili, PA-C  fluticasone [facility]) 50 MCG/ACT nasal spray Place 1 spray into both nostrils daily. Patient not taking: Reported on 05/06/2019 03/19/16   Audry Pili, PA-C    Allergies    Nyquil multi-symptom [pseudoeph-doxylamine-dm-apap]  Review of Systems   Review of Systems  Constitutional: Negative for fever.  Skin: Positive for wound. Negative for color change.    Physical Exam Updated Vital Signs BP (!) 131/91 (BP Location: Left Arm)   Pulse 63   Temp 98.3 F (36.8 C) (Oral)   Resp 16   Ht 5\' 8"  (1.727 m)   Wt 75 kg   SpO2 100%   BMI 25.14 kg/m   Physical Exam Constitutional:      General: He is not in acute distress.    Appearance: He is well-developed.  Eyes:     Conjunctiva/sclera: Conjunctivae normal.   Cardiovascular:     Rate and Rhythm: Normal rate.  Pulmonary:     Effort: Pulmonary effort is normal.  Skin:    General: Skin is warm.     Comments: Well healing wound to the left hand with 3 sutures in place. No erythema, warmth or swelling.   Neurological:     Mental Status: He is alert and oriented to person, place, and time.     ED Results / Procedures / Treatments   Labs (all labs ordered are listed, but only abnormal results are displayed) Labs Reviewed - No data to display  EKG None  Radiology No results found.  Procedures .Suture Removal  Date/Time: 05/20/2019 2:47 PM Performed by: 07/20/2019, PA-C Authorized by: Karrie Meres, PA-C   Consent:    Consent obtained:  Verbal   Consent given by:  Patient   Risks discussed:  Bleeding and pain Location:    Location:  Upper extremity   Upper extremity location:  Hand   Hand location:  L hand Procedure details:    Wound appearance:  No signs of infection   Number of sutures removed:  3 Post-procedure details:    Post-removal:  No dressing applied   Patient tolerance of procedure:  Tolerated well, no immediate complications   (including critical care time)  Medications Ordered in ED Medications - No data to display  ED Course  I have reviewed the triage vital signs and the nursing notes.  Pertinent labs & imaging results that were available during my care of the patient were reviewed by me and considered in my medical decision making (see chart for details).    MDM Rules/Calculators/A&P                      Pt to ER for staple/suture removal and wound check as above. Procedure tolerated well. Vitals normal, no signs of infection. Scar minimization & return precautions given at dc.    Final Clinical Impression(s) / ED Diagnoses Final diagnoses:  Visit for suture removal    Rx / DC Orders ED Discharge Orders    None       Bishop Dublin 05/20/19 1450    Lacretia Leigh,  MD 05/22/19 [phone]

## 2019-07-11 ENCOUNTER — Other Ambulatory Visit: Payer: Self-pay

## 2019-07-11 ENCOUNTER — Encounter (HOSPITAL_COMMUNITY): Payer: Self-pay | Admitting: Emergency Medicine

## 2019-07-11 ENCOUNTER — Emergency Department (HOSPITAL_COMMUNITY)
Admission: EM | Admit: 2019-07-11 | Discharge: 2019-07-11 | Disposition: A | Discharge status: Home / Self Care | Payer: Medicaid Other | Attending: Emergency Medicine | Admitting: Emergency Medicine

## 2019-07-11 DIAGNOSIS — M7918 Myalgia, other site: Secondary | ICD-10-CM | POA: Insufficient documentation

## 2019-07-11 DIAGNOSIS — M79642 Pain in left hand: Secondary | ICD-10-CM | POA: Diagnosis present

## 2019-07-11 NOTE — Discharge Instructions (Addendum)
Please take over-the-counter medication such as Tylenol or ibuprofen for pain. Follow-up with the hand specialist for evaluation of your persistent pain.

## 2019-07-11 NOTE — ED Provider Notes (Signed)
[facility]-EMERGENCY DEPT Provider Note   CSN: [id number] Arrival date & time: 07/11/19  1925     History Chief Complaint  Patient presents with  . Hand Pain    Martin Jackson is a 34 y.o. male with recent history of left hand laceration that occurred on 05/06/2019 on glass, presenting with residual pain to his left hand.  He states he is having localized pain around the location of the previous wound that is worse with movement.  The pain is sometimes burning.  It is better with rest.  No signs of infection reported.  No numbness.   The history is provided by the patient.       History reviewed. No pertinent past medical history.  There are no problems to display for this patient.   Past Surgical History:  Procedure Laterality Date  . ABDOMINAL SURGERY         Family History  Problem Relation Age of Onset  . Healthy Mother   . Healthy Father     Social History   Tobacco Use  . Smoking status: Never Smoker  . Smokeless tobacco: Never Used  Vaping Use  . Vaping Use: Never used  Substance Use Topics  . Alcohol use: No  . Drug use: No    Home Medications Prior to Admission medications   Medication Sig Start Date End Date Taking? Authorizing Provider  doxycycline (VIBRAMYCIN) 100 MG capsule Take 1 capsule (100 mg total) by mouth 2 (two) times daily. Patient not taking: Reported on 05/06/2019 03/23/16   Audry Pili, PA-C  fluticasone [facility]) 50 MCG/ACT nasal spray Place 1 spray into both nostrils daily. Patient not taking: Reported on 05/06/2019 03/19/16   Audry Pili, PA-C    Allergies    Nyquil multi-symptom [pseudoeph-doxylamine-dm-apap]  Review of Systems   Review of Systems  Musculoskeletal: Positive for myalgias.  Neurological: Negative for numbness.    Physical Exam Updated Vital Signs BP 116/88   Pulse 74   Temp 98 F (36.7 C)   Resp 17   SpO2 97%   Physical Exam Vitals and nursing note reviewed.  Constitutional:       General: He is not in acute distress.    Appearance: He is well-developed.  HENT:     Head: Normocephalic and atraumatic.  Eyes:     Conjunctiva/sclera: Conjunctivae normal.  Cardiovascular:     Rate and Rhythm: Normal rate.  Pulmonary:     Effort: Pulmonary effort is normal.  Musculoskeletal:     Comments: Left ulnar aspect of the hand, just proximal to the MCP joint, with healed wound. Scar tissue is palpated, somewhat tender. No redness or fluctuance. Good strength with finger opposition and grip. Normal sensation.   Neurological:     Mental Status: He is alert.  Psychiatric:        Mood and Affect: Mood normal.        Behavior: Behavior normal.     ED Results / Procedures / Treatments   Labs (all labs ordered are listed, but only abnormal results are displayed) Labs Reviewed - No data to display  EKG None  Radiology No results found.  Procedures Procedures (including critical care time) EMERGENCY DEPARTMENT US SOFT TISSUE INTERPRETATION "Study: Limited Soft Tissue Ultrasound"  INDICATIONS: Pain Multiple views of the body part were obtained in real-time with a multi-frequency linear probe  PERFORMED BY: Myself IMAGES ARCHIVED?: Yes SIDE:Left BODY PART:Upper extremity INTERPRETATION:  Normal soft tissue ultrasound    Medications Ordered  in ED Medications - No data to display  ED Course  I have reviewed the triage vital signs and the nursing notes.  Pertinent labs & imaging results that were available during my care of the patient were reviewed by me and considered in my medical decision making (see chart for details).    MDM Rules/Calculators/A&P                          Patient presenting with residual pain to the left hand after laceration on glass in April of this year.  It appears to have healed well, no signs of infection.  He has good strength and sensation.  Bedside ultrasound performed without obvious evidence of retained piece of glass/foreign  body.  Will provide hand referral for follow-up given ongoing pain.  OTC medications recommended for symptoms.  He is agreeable plan and safe for discharge. Final Clinical Impression(s) / ED Diagnoses Final diagnoses:  Left hand pain    Rx / DC Orders ED Discharge Orders    None       Autumnrose Yore, [place] N, PA-C 07/11/19 2032    Derwood Kaplan, MD 07/12/19 1425

## 2019-07-11 NOTE — ED Triage Notes (Signed)
Patient reports injury with stiches to left hand x2 months ago. Reports continued pain with movement in hand since suture removal.

## 2020-01-24 ENCOUNTER — Encounter (HOSPITAL_COMMUNITY): Payer: Self-pay | Admitting: Emergency Medicine

## 2020-01-24 ENCOUNTER — Other Ambulatory Visit: Payer: Self-pay

## 2020-01-24 DIAGNOSIS — Z20822 Contact with and (suspected) exposure to covid-19: Secondary | ICD-10-CM | POA: Diagnosis not present

## 2020-01-24 DIAGNOSIS — M79662 Pain in left lower leg: Secondary | ICD-10-CM | POA: Insufficient documentation

## 2020-01-24 DIAGNOSIS — M79661 Pain in right lower leg: Secondary | ICD-10-CM | POA: Diagnosis present

## 2020-01-24 NOTE — ED Triage Notes (Signed)
Pt reports and episode of bilateral leg weakness, tiredness, and pain when trying to go up the stairs tonight at around 8p. States that he was dx'd with COVID on 12/15. Ambulatory.

## 2020-01-25 ENCOUNTER — Encounter (HOSPITAL_COMMUNITY): Payer: Self-pay | Admitting: Emergency Medicine

## 2020-01-25 ENCOUNTER — Emergency Department (HOSPITAL_COMMUNITY)
Admission: EM | Admit: 2020-01-25 | Discharge: 2020-01-25 | Disposition: A | Discharge status: Home / Self Care | Payer: Medicaid Other | Attending: Emergency Medicine | Admitting: Emergency Medicine

## 2020-01-25 DIAGNOSIS — M79661 Pain in right lower leg: Secondary | ICD-10-CM

## 2020-01-25 DIAGNOSIS — M79662 Pain in left lower leg: Secondary | ICD-10-CM

## 2020-01-25 LAB — SARS CORONAVIRUS 2 (TAT 6-24 HRS): SARS Coronavirus 2: NEGATIVE

## 2020-01-25 LAB — I-STAT CHEM 8, ED
BUN: 23 mg/dL — ABNORMAL HIGH (ref 6–20)
Calcium, Ion: 1.18 mmol/L (ref 1.15–1.40)
Chloride: 104 mmol/L (ref 98–111)
Creatinine, Ser: 0.8 mg/dL (ref 0.61–1.24)
Glucose, Bld: 100 mg/dL — ABNORMAL HIGH (ref 70–99)
HCT: 39 % (ref 39.0–52.0)
Hemoglobin: 13.3 g/dL (ref 13.0–17.0)
Potassium: 3.7 mmol/L (ref 3.5–5.1)
Sodium: 138 mmol/L (ref 135–145)
TCO2: 26 mmol/L (ref 22–32)

## 2020-01-25 NOTE — ED Provider Notes (Signed)
[facility]-EMERGENCY DEPT Provider Note   CSN: [id number] Arrival date & time: 01/24/20  2300     History Chief Complaint  Patient presents with  . Leg Pain    Martin Jackson is a 35 y.o. male.   Leg Pain Location:  Knee Injury: no   Knee location:  L knee and R knee Pain details:    Quality:  Dull   Radiates to:  Does not radiate   Severity:  Moderate   Onset quality:  Gradual   Timing:  Constant   Progression:  Resolved Chronicity:  New Dislocation: no   Foreign body present:  No foreign bodies Prior injury to area:  No Relieved by:  Nothing Worsened by:  Nothing Ineffective treatments:  None tried Associated symptoms: no back pain, no decreased ROM, no fever, no itching, no muscle weakness, no neck pain, no numbness, no stiffness, no swelling and no tingling   Risk factors: no concern for non-accidental trauma   Patient with covid in December who is supposed to be on oxygen presents with knee pain and legs feeling fatigued at work.  He states he is not using his oxygen as directed and job was concerned he had covid again.      History reviewed. No pertinent past medical history.  There are no problems to display for this patient.   Past Surgical History:  Procedure Laterality Date  . ABDOMINAL SURGERY         Family History  Problem Relation Age of Onset  . Healthy Mother   . Healthy Father     Social History   Tobacco Use  . Smoking status: Never Smoker  . Smokeless tobacco: Never Used  Vaping Use  . Vaping Use: Never used  Substance Use Topics  . Alcohol use: No  . Drug use: No    Home Medications Prior to Admission medications   Medication Sig Start Date End Date Taking? Authorizing Provider  doxycycline (VIBRAMYCIN) 100 MG capsule Take 1 capsule (100 mg total) by mouth 2 (two) times daily. Patient not taking: Reported on 05/06/2019 03/23/16   Audry Pili, PA-C  fluticasone [facility]) 50 MCG/ACT nasal spray Place 1 spray  into both nostrils daily. Patient not taking: Reported on 05/06/2019 03/19/16   Audry Pili, PA-C    Allergies    Nyquil multi-symptom [pseudoeph-doxylamine-dm-apap]  Review of Systems   Review of Systems  Constitutional: Negative for fever.  HENT: Negative for congestion.   Eyes: Negative for visual disturbance.  Respiratory: Negative for cough, chest tightness and shortness of breath.   Cardiovascular: Negative for chest pain, palpitations and leg swelling.  Gastrointestinal: Negative for abdominal pain.  Genitourinary: Negative for difficulty urinating.  Musculoskeletal: Negative for back pain, joint swelling, neck pain and stiffness.  Skin: Negative for itching and rash.  Neurological: Negative for dizziness.  Psychiatric/Behavioral: Negative for agitation.  All other systems reviewed and are negative.   Physical Exam Updated Vital Signs BP 128/90 (BP Location: Left Arm)   Pulse 73   Temp 98.1 F (36.7 C) (Oral)   Resp 17   Ht 5\' 8"  (1.727 m)   Wt 68 kg   SpO2 97%   BMI 22.81 kg/m   Physical Exam Vitals and nursing note reviewed.  Constitutional:      General: He is not in acute distress.    Appearance: Normal appearance.  HENT:     Head: Normocephalic and atraumatic.     Nose: Nose normal.  Eyes:  Conjunctiva/sclera: Conjunctivae normal.     Pupils: Pupils are equal, round, and reactive to light.  Cardiovascular:     Rate and Rhythm: Normal rate and regular rhythm.     Pulses: Normal pulses.     Heart sounds: Normal heart sounds.  Pulmonary:     Effort: Pulmonary effort is normal.     Breath sounds: Normal breath sounds.  Abdominal:     General: Abdomen is flat. Bowel sounds are normal.     Palpations: Abdomen is soft.     Tenderness: There is no abdominal tenderness. There is no guarding.  Musculoskeletal:        General: No tenderness. Normal range of motion.     Cervical back: Normal range of motion and neck supple.     Right lower leg: No edema.      Left lower leg: No edema.     Comments: Negative Homan's signs, 5/5 BLE strength, intact 2+ DTRs  Skin:    General: Skin is warm and dry.     Capillary Refill: Capillary refill takes less than 2 seconds.  Neurological:     General: No focal deficit present.     Mental Status: He is alert and oriented to person, place, and time.     Deep Tendon Reflexes: Reflexes normal.  Psychiatric:        Mood and Affect: Mood normal.        Behavior: Behavior normal.     ED Results / Procedures / Treatments   Labs (all labs ordered are listed, but only abnormal results are displayed) Labs Reviewed  SARS CORONAVIRUS 2 (TAT 6-24 HRS)  I-STAT CHEM 8, ED    EKG None  Radiology No results found.  Procedures Procedures (including critical care time)   ED Course  I have reviewed the triage vital signs and the nursing notes.  Pertinent labs & imaging results that were available during my care of the patient were reviewed by me and considered in my medical decision making (see chart for details).   Sent in by employer as employer was concerned for covid.  I believe this is fatigue as patient is supposed to be wearing oxygen but is not.      Martin Jackson was evaluated in Emergency Department on 01/25/2020 for the symptoms described in the history of present illness. He was evaluated in the context of the global COVID-19 pandemic, which necessitated consideration that the patient might be at risk for infection with the SARS-CoV-2 virus that causes COVID-19. Institutional protocols and algorithms that pertain to the evaluation of patients at risk for COVID-19 are in a state of rapid change based on information released by regulatory bodies including the CDC and federal and state organizations. These policies and algorithms were followed during the patient's care in the ED.  Final Clinical Impression(s) / ED Diagnoses  Return for intractable cough, coughing up blood,fevers >100.4  unrelieved by medication, shortness of breath, intractable vomiting, chest pain, shortness of breath, weakness,numbness, changes in speech, facial asymmetry,abdominal pain, passing out,Inability to tolerate liquids or food, cough, altered mental status or any concerns. No signs of systemic illness or infection. The patient is nontoxic-appearing on exam and vital signs are within normal limits.   I have reviewed the triage vital signs and the nursing notes. Pertinent labs &imaging results that were available during my care of the patient were reviewed by me and considered in my medical decision making (see chart for details).After history, exam, and medical workup I  feel the patient has beenappropriately medically screened and is safe for discharge home. Pertinent diagnoses were discussed with the patient. Patient was given return precautions.   Terril Chestnut, MD 01/25/20 0502

## 2020-04-20 ENCOUNTER — Ambulatory Visit (INDEPENDENT_AMBULATORY_CARE_PROVIDER_SITE_OTHER): Payer: Medicaid Other

## 2020-04-20 ENCOUNTER — Other Ambulatory Visit: Payer: Self-pay

## 2020-04-20 ENCOUNTER — Ambulatory Visit (HOSPITAL_COMMUNITY)
Admission: EM | Admit: 2020-04-20 | Discharge: 2020-04-20 | Disposition: A | Discharge status: Home / Self Care | Payer: Medicaid Other | Attending: Emergency Medicine | Admitting: Emergency Medicine

## 2020-04-20 ENCOUNTER — Encounter (HOSPITAL_COMMUNITY): Payer: Self-pay

## 2020-04-20 DIAGNOSIS — R0602 Shortness of breath: Secondary | ICD-10-CM | POA: Diagnosis not present

## 2020-04-20 DIAGNOSIS — R059 Cough, unspecified: Secondary | ICD-10-CM

## 2020-04-20 DIAGNOSIS — R062 Wheezing: Secondary | ICD-10-CM

## 2020-04-20 MED ORDER — PREDNISONE 20 MG PO TABS
40.0000 mg | ORAL_TABLET | Freq: Every day | ORAL | 0 refills | Status: AC
Start: 2020-04-20 — End: 2020-04-25

## 2020-04-20 MED ORDER — CETIRIZINE HCL 10 MG PO TABS: 10.0000 mg | ORAL_TABLET | Freq: Every day | ORAL | 0 refills | Status: AC

## 2020-04-20 MED ORDER — ALBUTEROL SULFATE HFA 108 (90 BASE) MCG/ACT IN AERS: 1.0000 | INHALATION_SPRAY | Freq: Four times a day (QID) | RESPIRATORY_TRACT | 0 refills | Status: AC | PRN

## 2020-04-20 NOTE — ED Triage Notes (Signed)
Pt in with c/o productive cough that has been going on for 1 month. Also c/o fatigue since being diagnosed with covid back in December  Pt has been taking motrin and cough medicine

## 2020-04-20 NOTE — Discharge Instructions (Addendum)
Your chest xray may demonstrate findings consistent with asthma. I am hopeful that the prednisone provided helps with your symptoms.  Use of inhaler as needed for wheezing or shortness of breath.   I do recommend using zyrtec daily as well.  If symptoms worsen or do not improve in the next week to return to be seen or to follow up with your PCP.

## 2020-04-20 NOTE — ED Provider Notes (Signed)
[facility]    CSN: [id number] Arrival date & time: 04/20/20  1627      History   Chief Complaint Chief Complaint  Patient presents with  . Cough  . Fatigue    HPI Martin Jackson is a 35 y.o. male.   Martin Jackson presents with complaints of productive cough for the past month. He states he had covid-19 in December of 2021 and feels like "nothing is the same."  He feels like typical colds are lingering for him longer than usual. No shortness of breath . Cough is productive. He experiences joint aches with increased activity. He has woken up sweaty but no other known fevers. No gi symptoms. No known ill contacts.    ROS per HPI, negative if not otherwise mentioned.      History reviewed. No pertinent past medical history.  There are no problems to display for this patient.   Past Surgical History:  Procedure Laterality Date  . ABDOMINAL SURGERY         Home Medications    Prior to Admission medications   Medication Sig Start Date End Date Taking? Authorizing Provider  albuterol (PROAIR HFA) 108 (90 Base) MCG/ACT inhaler Inhale 1-2 puffs into the lungs every 6 (six) hours as needed for wheezing or shortness of breath. 04/20/20  Yes Linus Mako B, NP  cetirizine (ZYRTEC) 10 MG tablet Take 1 tablet (10 mg total) by mouth daily. 04/20/20  Yes Sritha Chauncey, Dorene Grebe B, NP  predniSONE (DELTASONE) 20 MG tablet Take 2 tablets (40 mg total) by mouth daily with breakfast for 5 days. 04/20/20 04/25/20 Yes Nargis Abrams, Barron Alvine, NP  doxycycline (VIBRAMYCIN) 100 MG capsule Take 1 capsule (100 mg total) by mouth 2 (two) times daily. Patient not taking: Reported on 05/06/2019 03/23/16   Audry Pili, PA-C  fluticasone [facility]) 50 MCG/ACT nasal spray Place 1 spray into both nostrils daily. Patient not taking: Reported on 05/06/2019 03/19/16   Audry Pili, PA-C    Family History Family History  Problem Relation Age of Onset  . Healthy Mother   . Healthy Father     Social  History Social History   Tobacco Use  . Smoking status: Never Smoker  . Smokeless tobacco: Never Used  Vaping Use  . Vaping Use: Never used  Substance Use Topics  . Alcohol use: No  . Drug use: No     Allergies   Nyquil multi-symptom [pseudoeph-doxylamine-dm-apap]   Review of Systems Review of Systems   Physical Exam Triage Vital Signs ED Triage Vitals  Enc Vitals Group     BP 04/20/20 1733 120/78     Pulse Rate 04/20/20 1732 82     Resp 04/20/20 1732 19     Temp 04/20/20 1732 98.6 F (37 C)     Temp src --      SpO2 04/20/20 1732 96 %     Weight --      Height --      Head Circumference --      Peak Flow --      Pain Score 04/20/20 1731 4     Pain Loc --      Pain Edu? --      Excl. in GC? --    No data found.  Updated Vital Signs BP 120/78   Pulse 82   Temp 98.6 F (37 C)   Resp 19   SpO2 96%    Physical Exam Constitutional:      Appearance: He is well-developed.  HENT:     Mouth/Throat:     Mouth: Mucous membranes are moist.     Pharynx: No posterior oropharyngeal erythema.  Cardiovascular:     Rate and Rhythm: Normal rate.  Pulmonary:     Effort: Pulmonary effort is normal.     Breath sounds: Normal breath sounds. No wheezing.  Skin:    General: Skin is warm and dry.  Neurological:     Mental Status: He is alert and oriented to person, place, and time.      UC Treatments / Results  Labs (all labs ordered are listed, but only abnormal results are displayed) Labs Reviewed - No data to display  EKG   Radiology DG Chest 2 View  Result Date: 04/20/2020 CLINICAL DATA:  Cough for 1 month. Wheezing and shortness of breath. EXAM: CHEST - 2 VIEW COMPARISON:  12/26/2018 FINDINGS: The cardiomediastinal contours are normal. Mild peribronchial thickening. Pulmonary vasculature is normal. No consolidation, pleural effusion, or pneumothorax. No acute osseous abnormalities are seen. IMPRESSION: Mild peribronchial thickening suggesting asthma or  bronchitis. Electronically Signed   By: Narda Rutherford M.D.   On: 04/20/2020 18:19    Procedures Procedures (including critical care time)  Medications Ordered in UC Medications - No data to display  Initial Impression / Assessment and Plan / UC Course  I have reviewed the triage vital signs and the nursing notes.  Pertinent labs & imaging results that were available during my care of the patient were reviewed by me and considered in my medical decision making (see chart for details).     Asthma vs bronchitis on xray, consistent with persistent cough. No history of asthma. Prednisone, inhaler and zyrtec recommended at this time with return and follow up recommendations provided. Patient verbalized understanding and agreeable to plan.   Final Clinical Impressions(s) / UC Diagnoses   Final diagnoses:  Cough     Discharge Instructions     Your chest xray may demonstrate findings consistent with asthma. I am hopeful that the prednisone provided helps with your symptoms.  Use of inhaler as needed for wheezing or shortness of breath.   I do recommend using zyrtec daily as well.  If symptoms worsen or do not improve in the next week to return to be seen or to follow up with your PCP.      ED Prescriptions    Medication Sig Dispense Auth. Provider   predniSONE (DELTASONE) 20 MG tablet Take 2 tablets (40 mg total) by mouth daily with breakfast for 5 days. 10 tablet Linus Mako B, NP   albuterol (PROAIR HFA) 108 (90 Base) MCG/ACT inhaler Inhale 1-2 puffs into the lungs every 6 (six) hours as needed for wheezing or shortness of breath. 1 each Georgetta Haber, NP   cetirizine (ZYRTEC) 10 MG tablet Take 1 tablet (10 mg total) by mouth daily. 30 tablet Georgetta Haber, NP     PDMP not reviewed this encounter.   Georgetta Haber, NP 04/20/20 1829

## 2020-08-15 IMAGING — CR DG CHEST 2V
2 series · 2 of 2 positions shown · non-contrast
Comparison: March 19, 2016

CLINICAL DATA: Chest pain

EXAM:
CHEST - 2 VIEW

[w chest pa]
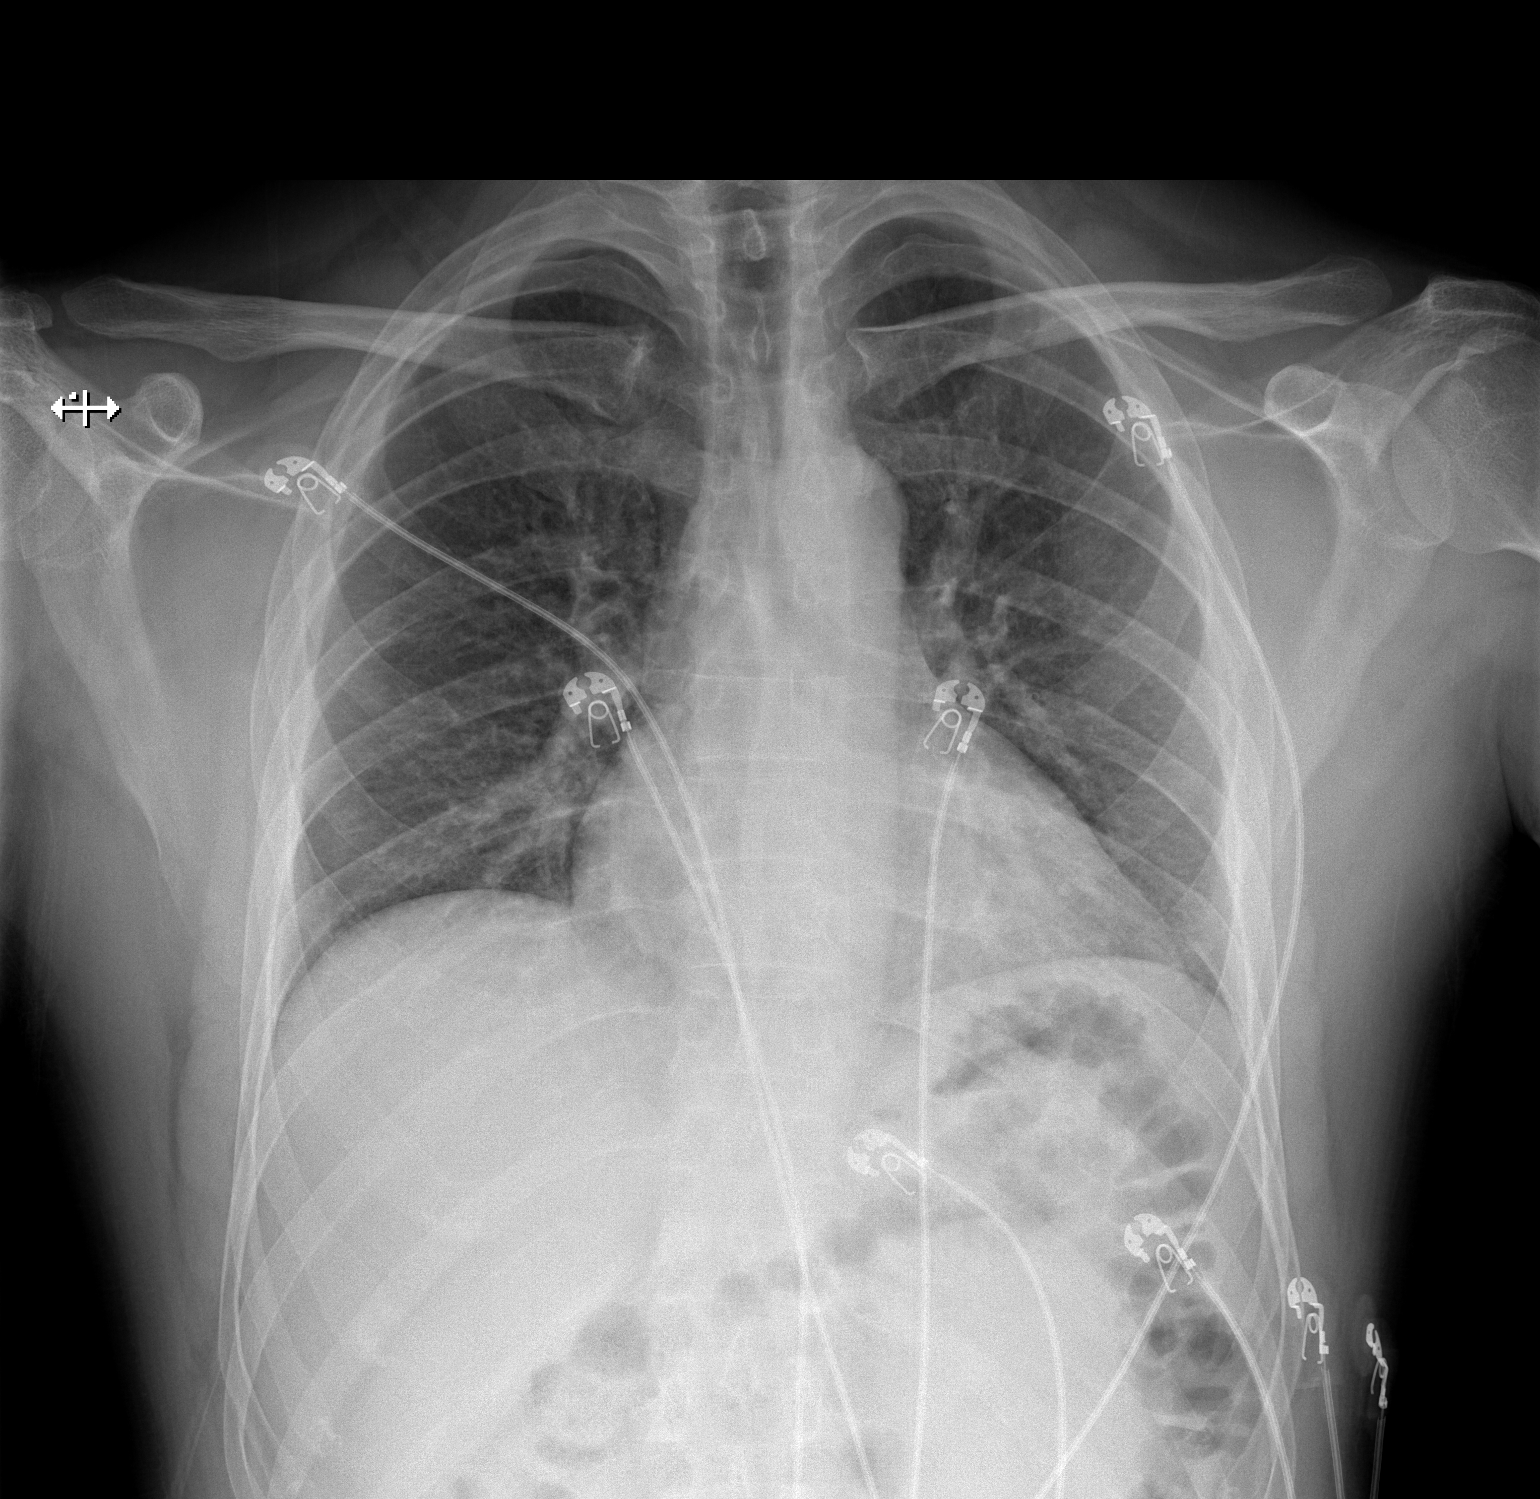

[w chest lat]
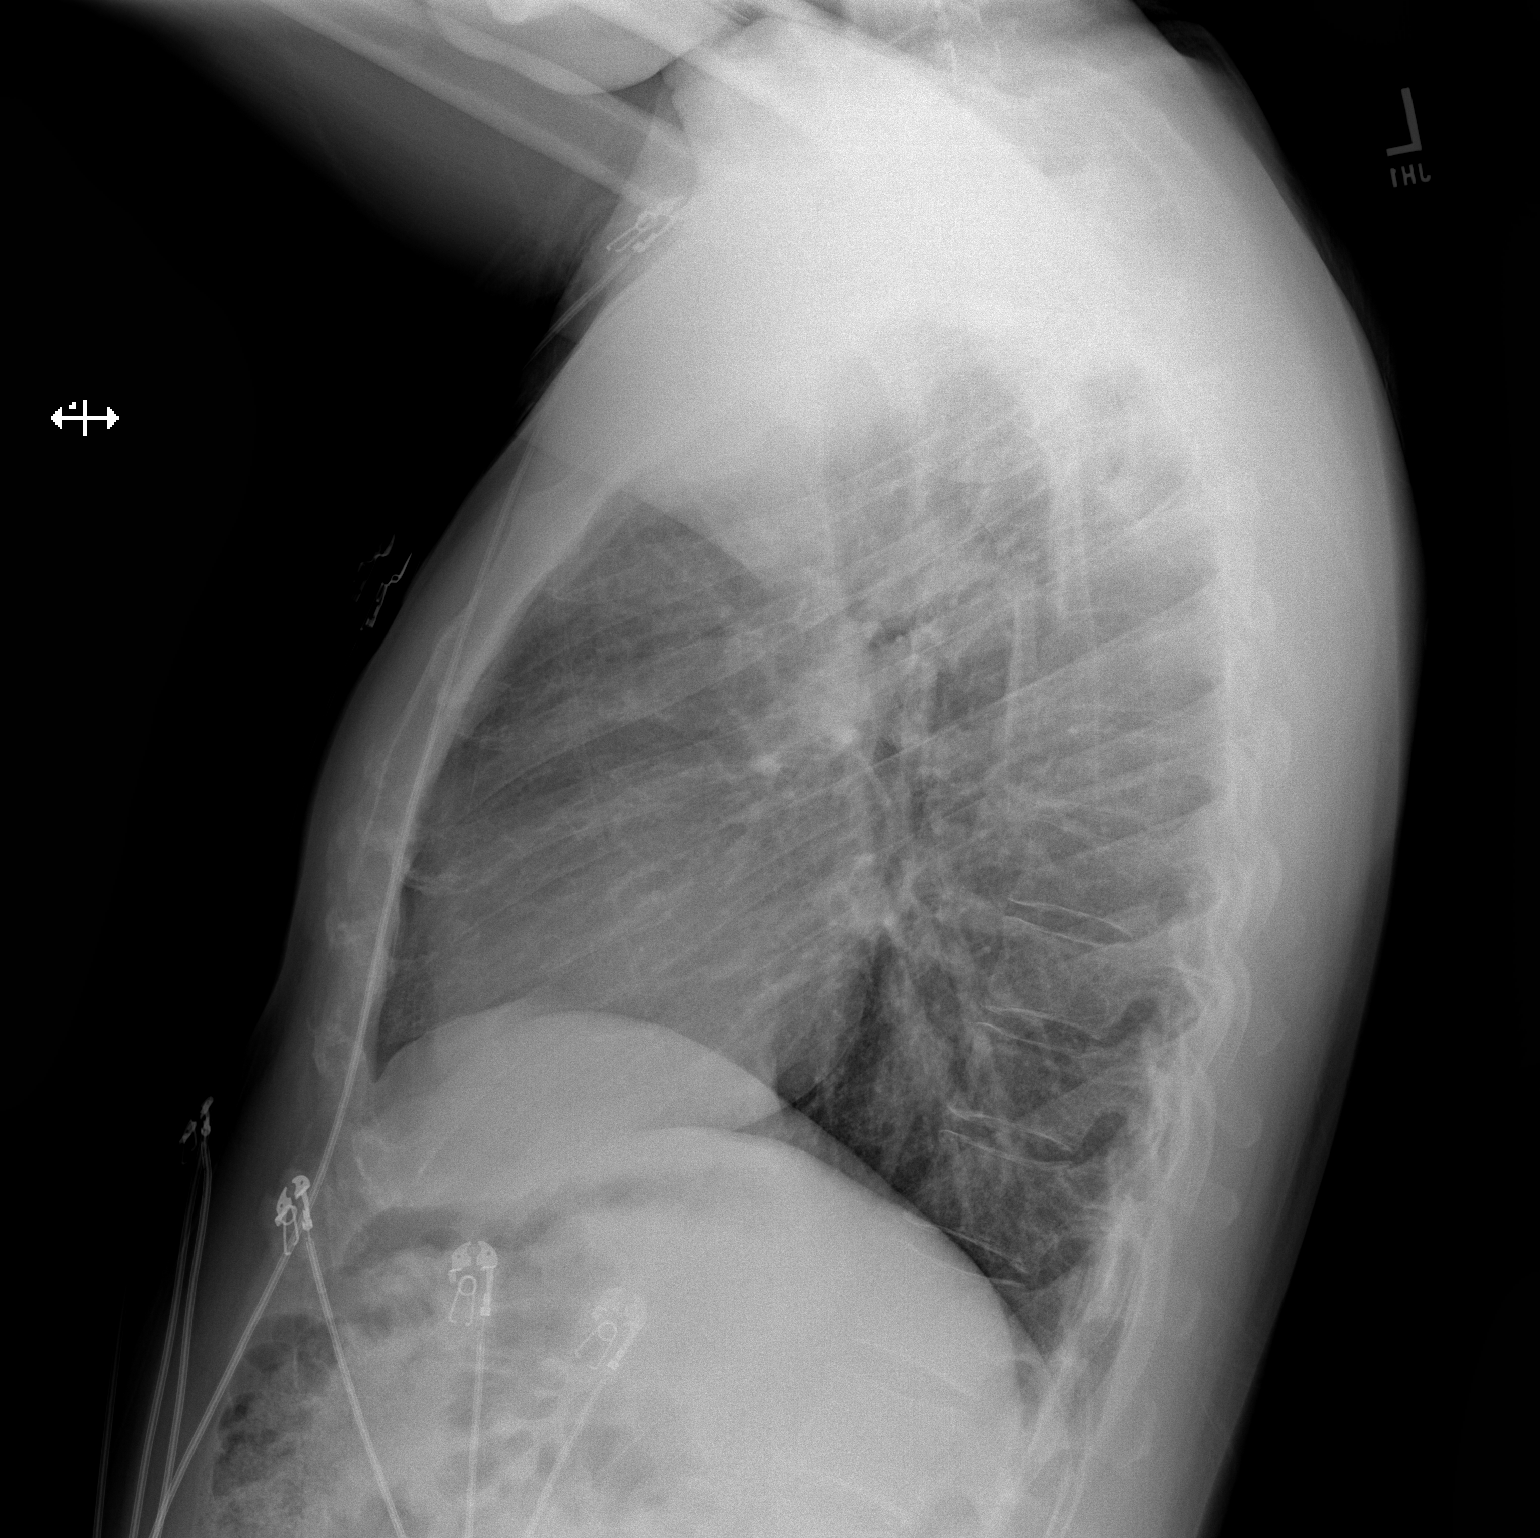

[2 of 2 positions shown; findings below may reference images not displayed]

FINDINGS: The heart size and mediastinal contours are within normal limits.
Both lungs are clear. The visualized skeletal structures are
unremarkable.
IMPRESSION: No active cardiopulmonary disease.

## 2022-01-16 ENCOUNTER — Other Ambulatory Visit: Payer: Self-pay

## 2022-01-16 DIAGNOSIS — M953 Acquired deformity of neck: Secondary | ICD-10-CM

## 2022-01-17 ENCOUNTER — Other Ambulatory Visit: Payer: Self-pay | Admitting: Physical Therapy

## 2022-01-17 DIAGNOSIS — M953 Acquired deformity of neck: Secondary | ICD-10-CM

## 2022-02-02 ENCOUNTER — Ambulatory Visit
Admission: RE | Admit: 2022-02-02 | Discharge: 2022-02-02 | Disposition: A | Discharge status: Home / Self Care | Payer: Medicaid Other | Source: Ambulatory Visit | Attending: Physical Therapy | Admitting: Physical Therapy

## 2022-02-02 DIAGNOSIS — M953 Acquired deformity of neck: Secondary | ICD-10-CM

## 2023-04-11 ENCOUNTER — Emergency Department (HOSPITAL_COMMUNITY)
Admission: EM | Admit: 2023-04-11 | Discharge: 2023-04-11 | Disposition: A | Discharge status: Home / Self Care | Payer: Self-pay | Attending: Emergency Medicine | Admitting: Emergency Medicine

## 2023-04-11 ENCOUNTER — Other Ambulatory Visit: Payer: Self-pay

## 2023-04-11 DIAGNOSIS — R1031 Right lower quadrant pain: Secondary | ICD-10-CM | POA: Insufficient documentation

## 2023-04-11 MED ORDER — IBUPROFEN 400 MG PO TABS
400.0000 mg | ORAL_TABLET | Freq: Once | ORAL | Status: AC
  Administered 2023-04-11: 400 mg via ORAL
  Filled 2023-04-11: qty 1

## 2023-04-11 NOTE — ED Triage Notes (Signed)
 Patient states that he has R groin pain when he sits or when he talks loudly. States this has been intermittent x 3 years but consistent for one month.

## 2023-04-11 NOTE — ED Provider Notes (Addendum)
 [place] EMERGENCY DEPARTMENT AT [facility] Provider Note   CSN: [id number] Arrival date & time: 04/11/23  1823     History  Chief Complaint  Patient presents with   Groin Pain    Martin Jackson is a 38 y.o. male history of hernia repair at age 51 presented with right groin pain for the past 3 years that is intermittent.  Patient does workout daily by running and doing cardio in the gym.  Patient denies any dysuria, bulging, fevers, nausea/vomiting, constipation diarrhea, concerns for STDs, skin color changes.  Patient states he still passing gas.  Patient is not taking any medications for this.  Patient states that the pain does get worse when he speaks loudly.  The pain does not radiate anywhere and patient denies any flank pain.   Home Medications Prior to Admission medications   Medication Sig Start Date End Date Taking? Authorizing Provider  albuterol (PROAIR HFA) 108 (90 Base) MCG/ACT inhaler Inhale 1-2 puffs into the lungs every 6 (six) hours as needed for wheezing or shortness of breath. 04/20/20   Georgetta Haber, NP  cetirizine (ZYRTEC) 10 MG tablet Take 1 tablet (10 mg total) by mouth daily. 04/20/20   Georgetta Haber, NP  doxycycline (VIBRAMYCIN) 100 MG capsule Take 1 capsule (100 mg total) by mouth 2 (two) times daily. Patient not taking: Reported on 05/06/2019 03/23/16   Audry Pili, PA-C  fluticasone [facility]) 50 MCG/ACT nasal spray Place 1 spray into both nostrils daily. Patient not taking: Reported on 05/06/2019 03/19/16   Audry Pili, PA-C      Allergies    Nyquil multi-symptom [pseudoeph-doxylamine-dm-apap]    Review of Systems   Review of Systems  Physical Exam Updated Vital Signs BP 116/73 (BP Location: Right Arm)   Pulse 61   Temp 98.3 F (36.8 C)   Resp 16   SpO2 98%  Physical Exam Constitutional:      General: He is not in acute distress.    Comments: Resting comfortably on his phone and interacting with his child  Abdominal:      Palpations: Abdomen is soft.     Tenderness: There is no abdominal tenderness. There is no guarding or rebound.  Genitourinary:    Comments: Chaperone: Darryll Capers, RN No inguinal hernia noted or direct hernia No obvious deformities No skin color changes Mild tenderness noted to right inguinal crease area however I cannot palpate any deformities or step-offs No scrotal abnormalities or testicular tenderness Musculoskeletal:        General: Normal range of motion.     Comments: 5 out of 5 bilateral hip flexion, plantarflexion/dorsiflexion No pelvis tenderness or instability noted  Skin:    General: Skin is warm and dry.     Capillary Refill: Capillary refill takes less than 2 seconds.  Neurological:     Mental Status: He is alert.     Comments: Sensation intact distally Walks without issue     ED Results / Procedures / Treatments   Labs (all labs ordered are listed, but only abnormal results are displayed) Labs Reviewed - No data to display  EKG None  Radiology No results found.  Procedures Procedures    Medications Ordered in ED Medications  ibuprofen (ADVIL) tablet 400 mg (has no administration in time range)    ED Course/ Medical Decision Making/ A&P  Medical Decision Making Risk Prescription drug management.   Herve Haug 38 y.o. presented today for right groin pain. Working DDx that I considered at this time includes, but not limited to, MSK, fracture, IT band syndrome, hernia, testicular torsion, lymphadenopathy.  R/o DDx: fracture, IT band syndrome, hernia, testicular torsion, lymphadenopathy: These are considered less likely due to history of present illness, physical exam, labs/imaging findings  Review of prior external notes: 07/03/2022 office visit  Unique Tests and My Independent Interpretation: None  Social Determinants of Health: none  Discussion with Independent Historian: None  Discussion of Management of  Tests: None  Risk: Low: based on diagnostic testing/clinical impression and treatment plan  Risk Stratification Score: None  Plan: On exam patient was in no acute distress with stable vitals.  Patient was neuro vas intact and has good strength bilaterally with no tenderness to his pelvic area.  With a chaperone present a GU exam was conducted that does not show any acute abnormalities outside of some mild tenderness to the right inguinal crease area.  I do not appreciate any hernia on exam nor skin color changes or other abnormalities.  Patient has no abdominal tenderness or peritoneal signs so very low suspicion of intra-abdominal pathology.  This is been going on for 3 years and gets worse with movement along with yelling and so do not feel he needs blood tests or imaging and that this is most likely MSK as patient does run often.  Patient has had no trauma to the area and does not feel he needs x-ray at this time which I agree.  Will give patient Motrin here and have him follow-up with her primary care provider.  I discussed RICE protocol with the patient to which she agreed.  Patient was given return precautions. Patient stable for discharge at this time.  Patient verbalized understanding of plan.  This chart was dictated using voice recognition software.  Despite best efforts to proofread,  errors can occur which can change the documentation meaning.         Final Clinical Impression(s) / ED Diagnoses Final diagnoses:  Right inguinal pain    Rx / DC Orders ED Discharge Orders     None         Remi Deter 04/11/23 2035    Netta Corrigan, PA-C 04/11/23 2036    Gwyneth Sprout, MD 04/12/23 0000

## 2023-04-11 NOTE — Discharge Instructions (Addendum)
 Please follow-up with your primary care provider in regards to recent symptoms and ER visit.  Today your exam was reassuring you may have a muscle strain.  Please take ibuprofen every 6 hours needed for pain, rest over the next few days, use ice and follow-up with a primary care provider.  If symptoms change or worsen please return to the ER.

## 2023-05-29 ENCOUNTER — Encounter (HOSPITAL_COMMUNITY): Payer: Self-pay

## 2023-05-29 ENCOUNTER — Other Ambulatory Visit: Payer: Self-pay

## 2023-05-29 ENCOUNTER — Emergency Department (HOSPITAL_COMMUNITY): Payer: Self-pay

## 2023-05-29 ENCOUNTER — Emergency Department (HOSPITAL_COMMUNITY)
Admission: EM | Admit: 2023-05-29 | Discharge: 2023-05-29 | Disposition: A | Discharge status: Home / Self Care | Payer: Self-pay | Attending: Emergency Medicine | Admitting: Emergency Medicine

## 2023-05-29 DIAGNOSIS — M79671 Pain in right foot: Secondary | ICD-10-CM | POA: Insufficient documentation

## 2023-05-29 MED ORDER — DOXYCYCLINE HYCLATE 100 MG PO CAPS: 100.0000 mg | ORAL_CAPSULE | Freq: Two times a day (BID) | ORAL | 0 refills | Status: AC

## 2023-05-29 MED ORDER — PREDNISONE 20 MG PO TABS
60.0000 mg | ORAL_TABLET | Freq: Once | ORAL | Status: AC
  Administered 2023-05-29: 60 mg via ORAL
  Filled 2023-05-29: qty 3

## 2023-05-29 MED ORDER — KETOROLAC TROMETHAMINE 60 MG/2ML IM SOLN
60.0000 mg | Freq: Once | INTRAMUSCULAR | Status: AC
  Administered 2023-05-29: 60 mg via INTRAMUSCULAR
  Filled 2023-05-29: qty 2

## 2023-05-29 MED ORDER — HYDROCODONE-ACETAMINOPHEN 5-325 MG PO TABS
2.0000 | ORAL_TABLET | ORAL | 0 refills | Status: AC | PRN
Start: 2023-05-29 — End: ?

## 2023-05-29 MED ORDER — DOXYCYCLINE HYCLATE 100 MG PO TABS
100.0000 mg | ORAL_TABLET | Freq: Once | ORAL | Status: AC
  Administered 2023-05-29: 100 mg via ORAL
  Filled 2023-05-29: qty 1

## 2023-05-29 MED ORDER — PREDNISONE 20 MG PO TABS: ORAL_TABLET | ORAL | 0 refills | Status: AC

## 2023-05-29 NOTE — Discharge Instructions (Signed)
 I suspect you have gout.  Have close follow-up with your primary care doctor.  Keep your feet elevated.  If you start noticing any worsening symptoms such as increased swelling, redness, fevers or other worsening symptoms, please return to the emergency room immediately for reevaluation.  Start taking the prednisone  tomorrow.  You got your first dose here in the emergency department.

## 2023-05-29 NOTE — ED Triage Notes (Signed)
 Pt reports waking up two days ago with pain and swelling to right foot. Pt denies any injury.

## 2023-05-29 NOTE — ED Provider Notes (Signed)
 [place] EMERGENCY DEPARTMENT AT [facility] Provider Note   CSN: [id number] Arrival date & time: 05/29/23  1937     History  Chief Complaint  Patient presents with   Foot Pain    Martin Jackson is a 38 y.o. male.  Patient is a 38 year old male who presents with pain in his right foot.  He says it is in his right big toe.  It started about 2 to 3 days ago.  Its gradually gotten worse.  He denies any known injury to the area.  No wounds.  No fevers.  No history of prior joint issues.       Home Medications Prior to Admission medications   Medication Sig Start Date End Date Taking? Authorizing Provider  doxycycline  (VIBRAMYCIN ) 100 MG capsule Take 1 capsule (100 mg total) by mouth 2 (two) times daily. 05/29/23  Yes Hershel Los, MD  HYDROcodone-acetaminophen (NORCO/VICODIN) 5-325 MG tablet Take 2 tablets by mouth every 4 (four) hours as needed. 05/29/23  Yes Hershel Los, MD  predniSONE  (DELTASONE ) 20 MG tablet 2 tabs po daily x 4 days 05/29/23  Yes Hershel Los, MD  albuterol  (PROAIR  HFA) 108 (90 Base) MCG/ACT inhaler Inhale 1-2 puffs into the lungs every 6 (six) hours as needed for wheezing or shortness of breath. 04/20/20   Burky, Natalie B, NP  cetirizine  (ZYRTEC ) 10 MG tablet Take 1 tablet (10 mg total) by mouth daily. 04/20/20   Burky, Natalie B, NP  fluticasone  (FLONASE ) 50 MCG/ACT nasal spray Place 1 spray into both nostrils daily. Patient not taking: Reported on 05/06/2019 03/19/16   Tonya Fredrickson, PA-C      Allergies    Nyquil multi-symptom [[id number]-[id number]]    Review of Systems   Review of Systems  Constitutional:  Negative for fever.  Gastrointestinal:  Negative for nausea and vomiting.  Musculoskeletal:  Positive for arthralgias and joint swelling. Negative for back pain and neck pain.  Skin:  Negative for wound.  Neurological:  Negative for weakness, numbness and headaches.    Physical Exam Updated Vital Signs BP (!) 151/96   Pulse 72    Temp 98.3 F (36.8 C)   Resp 18   Ht 5\' 8"  (1.727 m)   Wt 81.6 kg   SpO2 98%   BMI 27.37 kg/m  Physical Exam Constitutional:      Appearance: He is well-developed.  HENT:     Head: Normocephalic and atraumatic.  Cardiovascular:     Rate and Rhythm: Normal rate.  Pulmonary:     Effort: Pulmonary effort is normal.  Musculoskeletal:        General: Tenderness present.     Cervical back: Normal range of motion and neck supple.     Comments: Patient has some warmth and erythema with swelling of the right big toe.  There is not any significant extension into the foot.  There is no pain in the ankle.  No streaking up the leg.  The swelling is localized to the big toe.  There is no swelling to the foot or ankle or lower leg.  Pedal pulses are intact.  He has some generalized tenderness to the area.  No wounds are noted.  Skin:    General: Skin is warm and dry.  Neurological:     Mental Status: He is alert and oriented to person, place, and time.     ED Results / Procedures / Treatments   Labs (all labs ordered are listed, but only abnormal results are displayed)  Labs Reviewed - No data to display  EKG None  Radiology DG Foot Complete Right Result Date: 05/29/2023 CLINICAL DATA:  Pain and swelling in the first toe. EXAM: RIGHT FOOT COMPLETE - 3+ VIEW COMPARISON:  None Available. FINDINGS: There is no evidence of fracture or dislocation. There is no evidence of arthropathy or other focal bone abnormality. Soft tissues are unremarkable. IMPRESSION: No acute abnormality noted Electronically Signed   By: Violeta Grey M.D.   On: 05/29/2023 20:43    Procedures Procedures    Medications Ordered in ED Medications  ketorolac (TORADOL) injection 60 mg (60 mg Intramuscular Given 05/29/23 2106)  predniSONE  (DELTASONE ) tablet 60 mg (60 mg Oral Given 05/29/23 2106)  doxycycline  (VIBRA -TABS) tablet 100 mg (100 mg Oral Given 05/29/23 2106)    ED Course/ Medical Decision Making/ A&P                                  Medical Decision Making Amount and/or Complexity of Data Reviewed Radiology: ordered.  Risk Prescription drug management.   Patient is a 38 year old who presents with pain and swelling to his right big toe.  X-rays were performed which were interpreted by me and confirmed by the radiologist to show no bony injury.  No fracture.  No evidence of osteomyelitis.  I suspect he has gout although he has not had any episodes of gout in the past.  I do not see any wounds.  I will go ahead and treat him for possible infection with doxycycline  although I suspect this is more related to gout.  He was given a dose of Toradol in the ED.  Will start him on a 5-day course of prednisone  as well as doxycycline .  He was given a prescription for a short course of Vicodin for pain.  I advised him to have close follow-up with his PCP for recheck within the next few days.  He was given strict return precautions.  Final Clinical Impression(s) / ED Diagnoses Final diagnoses:  Foot pain, right    Rx / DC Orders ED Discharge Orders          Ordered    predniSONE  (DELTASONE ) 20 MG tablet        05/29/23 2102    doxycycline  (VIBRAMYCIN ) 100 MG capsule  2 times daily        05/29/23 2102    HYDROcodone-acetaminophen (NORCO/VICODIN) 5-325 MG tablet  Every 4 hours PRN        05/29/23 2102              Hershel Los, MD 05/29/23 2114
